# Patient Record
Sex: Female | Born: 1985
Health system: Southern US, Community
[De-identification: ages and names within clinical notes are randomized; demographics above are authoritative.]

## PROBLEM LIST (undated history)

## (undated) DIAGNOSIS — R519 Headache, unspecified: Secondary | ICD-10-CM

## (undated) DIAGNOSIS — N83202 Unspecified ovarian cyst, left side: Secondary | ICD-10-CM

## (undated) DIAGNOSIS — J302 Other seasonal allergic rhinitis: Secondary | ICD-10-CM

## (undated) DIAGNOSIS — Z87891 Personal history of nicotine dependence: Secondary | ICD-10-CM

## (undated) DIAGNOSIS — F419 Anxiety disorder, unspecified: Secondary | ICD-10-CM

## (undated) DIAGNOSIS — Z8249 Family history of ischemic heart disease and other diseases of the circulatory system: Secondary | ICD-10-CM

## (undated) DIAGNOSIS — K219 Gastro-esophageal reflux disease without esophagitis: Secondary | ICD-10-CM

## (undated) HISTORY — DX: Gastro-esophageal reflux disease without esophagitis: K21.9

## (undated) HISTORY — DX: Family history of ischemic heart disease and other diseases of the circulatory system: Z82.49

## (undated) HISTORY — PX: WISDOM TOOTH EXTRACTION: SHX21

## (undated) HISTORY — DX: Personal history of nicotine dependence: Z87.891

---

## 2015-04-25 ENCOUNTER — Emergency Department
Admission: EM | Admit: 2015-04-25 | Discharge: 2015-04-25 | Disposition: A | Discharge status: Home / Self Care | Payer: 59 | Source: Home / Self Care | Attending: Family Medicine | Admitting: Family Medicine

## 2015-04-25 ENCOUNTER — Encounter: Payer: Self-pay | Admitting: Emergency Medicine

## 2015-04-25 DIAGNOSIS — K219 Gastro-esophageal reflux disease without esophagitis: Secondary | ICD-10-CM

## 2015-04-25 HISTORY — DX: Other seasonal allergic rhinitis: J30.2

## 2015-04-25 MED ORDER — OMEPRAZOLE 40 MG PO CPDR: 40.0000 mg | DELAYED_RELEASE_CAPSULE | Freq: Every day | ORAL | Status: DC

## 2015-04-25 NOTE — ED Notes (Signed)
Patient reports pressure in sternum area that is dull and annoying x 3 days; has used pepsid for previous heartburn, but it has not helped current condition; denies n/v/d.

## 2015-04-25 NOTE — Discharge Instructions (Signed)

## 2015-04-25 NOTE — ED Provider Notes (Signed)
CSN: [id number]     Arrival date & time 04/25/15  1607 History   First MD Initiated Contact with Patient 04/25/15 1644     Chief Complaint  Patient presents with  . Chest Pain      HPI Comments: Patient complains of 3 day history of intermittent substernal ache, worse after drinking coffee.  She has had similar pain in the past controlled with Pepcid which has not been helpful for her present symptoms.  No nausea/vomiting.  She states that she had an EKG several days ago that showed short pr syndrome.  She reports that she has just moved to the area with a new job, and is under increased stress.  The history is provided by the patient.    Past Medical History  Diagnosis Date  . Seasonal allergies    History reviewed. No pertinent past surgical history. No family history on file. Social History  Substance Use Topics  . Smoking status: Never Smoker   . Smokeless tobacco: None  . Alcohol Use: No   OB History    No data available     Review of Systems  Constitutional: Positive for appetite change. Negative for fever, chills, diaphoresis, activity change, fatigue and unexpected weight change.  HENT: Negative.   Eyes: Negative.   Respiratory: Negative.   Cardiovascular: Positive for chest pain. Negative for palpitations and leg swelling.  Gastrointestinal: Negative for nausea, vomiting, abdominal pain, diarrhea, constipation, blood in stool, abdominal distention and rectal pain.  Genitourinary: Negative.   Musculoskeletal: Negative.   Neurological: Negative for dizziness and headaches.  All other systems reviewed and are negative.   Allergies  Review of patient's allergies indicates no known allergies.  Home Medications   Prior to Admission medications   Medication Sig Start Date End Date Taking? Authorizing Provider  famotidine (PEPCID AC) 10 MG chewable tablet Chew 10 mg by mouth 2 (two) times daily.   Yes Historical Provider, MD  loratadine (CLARITIN) 10 MG tablet Take 10  mg by mouth daily.   Yes Historical Provider, MD  omeprazole (PRILOSEC) 40 MG capsule Take 1 capsule (40 mg total) by mouth daily. Take 30 minutes before breakfast 04/25/15   Lattie Haw, MD   Meds Ordered and Administered this Visit  Medications - No data to display  BP 110/74 mmHg  Pulse 79  Temp(Src) 98.4 F (36.9 C) (Oral)  Resp 16  Ht  (1.549 m)  Wt 115 lb (52.164 kg)  BMI 21.74 kg/m2  SpO2 99% No data found.   Physical Exam Nursing notes and Vital Signs reviewed. Appearance:  Patient appears stated age, and in no acute distress Eyes:  Pupils are equal, round, and reactive to light and accomodation.  Extraocular movement is intact.  Conjunctivae are not inflamed  Ears:  Canals normal.  Tympanic membranes normal.  Nose:  Normal turbinates.  No sinus tenderness.   Pharynx:  Normal Neck:  Supple.   No adenopathy Lungs:  Clear to auscultation.  Breath sounds are equal.  Moving air well. Chest:  No tenderness to palpation  Heart:  Regular rate and rhythm without murmurs, rubs, or gallops.  Abdomen:  Nontender without masses or hepatosplenomegaly.  Bowel sounds are present.  No CVA or flank tenderness.  Extremities:  No edema.  Skin:  No rash present.   ED Course  Procedures  None    MDM   1. Gastroesophageal reflux disease, esophagitis presence not specified    Begin omeprazole  QAM.  #15, 1  refill Reflux precaution info given.   Followup with Family Doctor in about 10 days, if improved would decrease to omeprazole 20mg  daily.    Lattie HawStephen A Buddy Loeffelholz, MD 04/28/15 [phone]

## 2015-05-29 ENCOUNTER — Encounter: Payer: Self-pay | Admitting: Osteopathic Medicine

## 2015-05-29 ENCOUNTER — Ambulatory Visit (INDEPENDENT_AMBULATORY_CARE_PROVIDER_SITE_OTHER): Payer: 59 | Admitting: Osteopathic Medicine

## 2015-05-29 VITALS — BP 118/82 | HR 72 | Ht 61.0 in | Wt 115.0 lb

## 2015-05-29 DIAGNOSIS — Z139 Encounter for screening, unspecified: Secondary | ICD-10-CM | POA: Diagnosis not present

## 2015-05-29 DIAGNOSIS — K219 Gastro-esophageal reflux disease without esophagitis: Secondary | ICD-10-CM | POA: Diagnosis not present

## 2015-05-29 DIAGNOSIS — Z0189 Encounter for other specified special examinations: Secondary | ICD-10-CM | POA: Diagnosis not present

## 2015-05-29 DIAGNOSIS — Z79899 Other long term (current) drug therapy: Secondary | ICD-10-CM | POA: Diagnosis not present

## 2015-05-29 DIAGNOSIS — Z87891 Personal history of nicotine dependence: Secondary | ICD-10-CM | POA: Diagnosis not present

## 2015-05-29 DIAGNOSIS — Z975 Presence of (intrauterine) contraceptive device: Secondary | ICD-10-CM | POA: Insufficient documentation

## 2015-05-29 DIAGNOSIS — F458 Other somatoform disorders: Secondary | ICD-10-CM

## 2015-05-29 DIAGNOSIS — Z8249 Family history of ischemic heart disease and other diseases of the circulatory system: Secondary | ICD-10-CM | POA: Diagnosis not present

## 2015-05-29 DIAGNOSIS — R0989 Other specified symptoms and signs involving the circulatory and respiratory systems: Secondary | ICD-10-CM

## 2015-05-29 DIAGNOSIS — Z0000 Encounter for general adult medical examination without abnormal findings: Secondary | ICD-10-CM | POA: Diagnosis not present

## 2015-05-29 DIAGNOSIS — R198 Other specified symptoms and signs involving the digestive system and abdomen: Secondary | ICD-10-CM

## 2015-05-29 HISTORY — DX: Family history of ischemic heart disease and other diseases of the circulatory system: Z82.49

## 2015-05-29 HISTORY — DX: Personal history of nicotine dependence: Z87.891

## 2015-05-29 HISTORY — DX: Gastro-esophageal reflux disease without esophagitis: K21.9

## 2015-05-29 LAB — COMPLETE METABOLIC PANEL WITH GFR
ALT: 9 U/L (ref 6–29)
AST: 12 U/L (ref 10–30)
Albumin: 4.5 g/dL (ref 3.6–5.1)
Alkaline Phosphatase: 51 U/L (ref 33–115)
BUN: 11 mg/dL (ref 7–25)
CHLORIDE: 108 mmol/L (ref 98–110)
CO2: 24 mmol/L (ref 20–31)
CREATININE: 0.72 mg/dL (ref 0.50–1.10)
Calcium: 9.3 mg/dL (ref 8.6–10.2)
GFR, Est African American: >89 mL/min (ref >=60)
GFR, Est Non African American: >89 mL/min (ref >=60)
Glucose, Bld: 69 mg/dL (ref 65–99)
Potassium: 4.2 mmol/L (ref 3.5–5.3)
Sodium: 141 mmol/L (ref 135–146)
TOTAL PROTEIN: 6.8 g/dL (ref 6.1–8.1)
Total Bilirubin: 0.5 mg/dL (ref 0.2–1.2)

## 2015-05-29 LAB — CBC WITH DIFFERENTIAL/PLATELET
BASOS PCT: 0 %
Basophils Absolute: 0 cells/uL (ref 0–200)
Eosinophils Absolute: 152 cells/uL (ref 15–500)
Eosinophils Relative: 4 %
HEMATOCRIT: 42.3 % (ref 35.0–45.0)
Hemoglobin: 14.1 g/dL (ref 11.7–15.5)
LYMPHS PCT: 43 %
Lymphs Abs: 1634 cells/uL (ref 850–3900)
MCH: 28.7 pg (ref 27.0–33.0)
MCHC: 33.3 g/dL (ref 32.0–36.0)
MCV: 86.2 fL (ref 80.0–100.0)
MONOS PCT: 7 %
MPV: 10.6 fL (ref 7.5–12.5)
Monocytes Absolute: 266 cells/uL (ref 200–950)
Neutro Abs: 1748 cells/uL (ref 1500–7800)
Neutrophils Relative %: 46 %
PLATELETS: 218 10*3/uL (ref 140–400)
RBC: 4.91 MIL/uL (ref 3.80–5.10)
RDW: 13.8 % (ref 11.0–15.0)
WBC: 3.8 10*3/uL (ref 3.8–10.8)

## 2015-05-29 LAB — LIPID PANEL
Cholesterol: 138 mg/dL (ref 125–200)
HDL: 59 mg/dL (ref >=46)
LDL CALC: 72 mg/dL (ref <130)
Total CHOL/HDL Ratio: 2.3 Ratio (ref <=5.0)
Triglycerides: 37 mg/dL (ref <150)
VLDL: 7 mg/dL (ref <30)

## 2015-05-29 LAB — TSH: TSH: 1.25 m[IU]/L

## 2015-05-29 NOTE — Progress Notes (Signed)
HPI: Monique Ellison is a 30 y.o. female who presents to [facility] today for chief complaint of:  Chief Complaint  Patient presents with  . Establish Care    Patient wants Annual Physical, check on GERD and lump in throat     . Location: throat/chest . Quality: ACID REFLUX, LUMP IN THROAT . Severity: was pretty bad but now better . Duration: 4-5 days then UC 1 month ago  . Timing: usually with eating . Modifying factors: Nexium started about a week ago, Omeproazole by Urgent Care. Lump better since starting Nexium. GERD gone away entirely. She has cut back on coffee, on eating prior to bedtime.   Assoc signs/symptoms: NO significant pain, lateralization of the symptoms, dysphagia, odynophagia, weight loss, a change in voice, presence of a neck or tonsillar mass, or unexplained cervical adenopathy.    Past medical, social and family history reviewed: Past Medical History  Diagnosis Date  . Seasonal allergies    Past Surgical History  Procedure Laterality Date  . Wisdom tooth extraction     Social History  Substance Use Topics  . Smoking status: Never Smoker   . Smokeless tobacco: Not on file  . Alcohol Use: No   Family History  Problem Relation Age of Onset  . Diabetes Mother   . Hyperlipidemia Mother   . Stroke Mother   . Hypertension Father   . Stroke Father   . Depression Brother   . Diabetes Maternal Grandmother     Current Outpatient Prescriptions  Medication Sig Dispense Refill  . esomeprazole (NEXIUM) 20 MG packet Take 20 mg by mouth daily before breakfast.    . loratadine (CLARITIN) 10 MG tablet Take 10 mg by mouth daily.     No current facility-administered medications for this visit.   No Known Allergies     Review of Systems: CONSTITUTIONAL:  No  fever, no chills, No  unintentional weight changes HEAD/EYES/EARS/NOSE/THROAT: No  headache, no vision change, no hearing change, No  sore throat, No  sinus  pressure CARDIAC: No  chest pain, No  pressure, No palpitations, No  orthopnea RESPIRATORY: No  cough, No  shortness of breath/wheeze GASTROINTESTINAL: No  nausea, No  vomiting, No  abdominal pain, No  blood in stool, No  diarrhea, No  constipation  MUSCULOSKELETAL: No  myalgia/arthralgia GENITOURINARY: No  incontinence, No  abnormal genital bleeding/discharge SKIN: No  rash/wounds/concerning lesions HEM/ONC: No  easy bruising/bleeding, No  abnormal lymph node ENDOCRINE: No polyuria/polydipsia/polyphagia, No  heat/cold intolerance  NEUROLOGIC: No  weakness, No  dizziness, No  slurred speech PSYCHIATRIC: No  concerns with depression, No  concerns with anxiety, No sleep problems  Exam:  BP 118/82 mmHg  Pulse 72  Ht  (1.549 m)  Wt 115 lb (52.164 kg)  BMI 21.74 kg/m2 Constitutional: VS see above. General Appearance: alert, well-developed, well-nourished, NAD Eyes: Normal lids and conjunctive, non-icteric sclera,  Ears, Nose, Mouth, Throat: MMM, Normal external inspection ears/nares/mouth/lips/gums, TM normal bilaterally. Pharynx no erythema, no exudate.  Neck: No masses, trachea midline. No thyroid enlargement/tenderness/mass appreciated. No lymphadenopathy Respiratory: Normal respiratory effort. no wheeze, no rhonchi, no rales Cardiovascular: S1/S2 normal, no murmur, no rub/gallop auscultated. RRR.  Gastrointestinal: Nontender, no masses. No hepatomegaly, no splenomegaly. No hernia appreciated. Bowel sounds normal. Rectal exam deferred.   Psychiatric: Normal judgment/insight. Normal mood and affect. Oriented x3.    No results found for this or any previous visit (from the past 72 hour(s)).    ASSESSMENT/PLAN: See  patient instructions, plan for 6 weeks PPI therapy then transition to H2 blocker or stop therapy altogether. Consider GI referral if symptoms worsen or fail to improve, RTC precautions reviewed if patient getting worse or if anything changes despite therapy. Otherwise plan  to follow-up for annual wellness exam/Pap.  Gastroesophageal reflux disease, esophagitis presence not specified - Plan: CBC with Differential/Platelet, COMPLETE METABOLIC PANEL WITH GFR  Globus sensation - Plan: TSH  Former smoker  Nexplanon in place  Family history of heart disease - Plan: Lipid panel  Screening - Plan: CBC with Differential/Platelet, COMPLETE METABOLIC PANEL WITH GFR, Lipid panel, TSH  Medication management - Plan: CBC with Differential/Platelet, COMPLETE METABOLIC PANEL WITH GFR   Return in about 6 weeks (around 07/10/2015), or (can come sooner if you'd like), for [employer] SoupNUAL WELLNESS EXAM/ RECHECK GI SYMPTOMS.

## 2015-05-29 NOTE — Patient Instructions (Addendum)
Plan to stay on the Nexium for another 6 weeks or so, then he can come off of this medication and see how the heartburn is doing. At that point you can wait and see if the heartburn has resolved, or you can transition to Zantac (ranitidine is generic) at either 150 mg twice a day or 300 mg once per day.   If heartburn persists or worsens, at that point we would consider a referral to GI for an endoscopy to evaluate for any other concerning causes of heartburn such as ulcers or gastritis. There is also a bacteria, H. pylori, which can cause heartburn symptoms however the better tests for this are not as accurate when someone has been on a medication such as omeprazole or Nexium, if we are looking at sending you to GI, they can test for this bacteria as well.   Regarding the globus sensation, this should get better as we treat the heartburn, however if it is persisting or getting worse we will send to GI for further testing and they will likely want to do a scope of the esophagus.   Please let us know if you're getting worse or if anything changes, otherwise let's plan to meet up again either in 6 weeks or so once you are off of the heartburn medicines, or we can do the annual physical/Pap smear sooner than that if you would like.

## 2015-06-12 ENCOUNTER — Telehealth: Payer: Self-pay | Admitting: *Deleted

## 2015-06-12 NOTE — Telephone Encounter (Signed)
Pt canceled appointment.

## 2015-06-15 ENCOUNTER — Ambulatory Visit: Payer: Self-pay | Admitting: Family Medicine

## 2015-08-10 ENCOUNTER — Telehealth: Payer: 59 | Admitting: Nurse Practitioner

## 2015-08-10 DIAGNOSIS — N3000 Acute cystitis without hematuria: Secondary | ICD-10-CM

## 2015-08-10 MED ORDER — SULFAMETHOXAZOLE-TRIMETHOPRIM 800-160 MG PO TABS: 1.0000 | ORAL_TABLET | Freq: Two times a day (BID) | ORAL | Status: DC

## 2015-08-10 NOTE — Progress Notes (Signed)

## 2015-11-25 DIAGNOSIS — Z6822 Body mass index (BMI) 22.0-22.9, adult: Secondary | ICD-10-CM | POA: Diagnosis not present

## 2015-11-25 DIAGNOSIS — Z01419 Encounter for gynecological examination (general) (routine) without abnormal findings: Secondary | ICD-10-CM | POA: Diagnosis not present

## 2015-11-25 LAB — HM PAP SMEAR: HM Pap smear: NEGATIVE

## 2016-05-04 DIAGNOSIS — Z3046 Encounter for surveillance of implantable subdermal contraceptive: Secondary | ICD-10-CM | POA: Diagnosis not present

## 2016-07-08 ENCOUNTER — Telehealth: Payer: 59 | Admitting: Family

## 2016-07-08 DIAGNOSIS — B9689 Other specified bacterial agents as the cause of diseases classified elsewhere: Secondary | ICD-10-CM | POA: Diagnosis not present

## 2016-07-08 DIAGNOSIS — J028 Acute pharyngitis due to other specified organisms: Secondary | ICD-10-CM | POA: Diagnosis not present

## 2016-07-08 MED ORDER — AZITHROMYCIN 250 MG PO TABS: ORAL_TABLET | ORAL | 0 refills | Status: DC

## 2016-07-08 MED ORDER — BENZONATATE 100 MG PO CAPS: 100.0000 mg | ORAL_CAPSULE | Freq: Three times a day (TID) | ORAL | 0 refills | Status: DC | PRN

## 2016-07-08 NOTE — Progress Notes (Signed)
We are sorry that you are not feeling well.  Here is how we plan to help!  Based on what you have shared with me it looks like you have upper respiratory tract inflammation that has resulted in a significant cough.  Inflammation and infection in the upper respiratory tract is commonly called bronchitis and has four common causes:  Allergies, Viral Infections, Acid Reflux and Bacterial Infections.  Allergies, viruses and acid reflux are treated by controlling symptoms or eliminating the cause. An example might be a cough caused by taking certain blood pressure medications. You stop the cough by changing the medication. Another example might be a cough caused by acid reflux. Controlling the reflux helps control the cough.  Based on your presentation I believe you most likely have A cough due to bacteria.  When patients have a fever and a productive cough with a change in color or increased sputum production, we are concerned about bacterial bronchitis.  If left untreated it can progress to pneumonia.  If your symptoms do not improve with your treatment plan it is important that you contact your provider.   I have prescribed Azithromyin 250 mg: two tables now and then one tablet daily for 4 additonal days    In addition you may use A non-prescription cough medication called Robitussin DAC. Take 2 teaspoons every 8 hours or Delsym: take 2 teaspoons every 12 hours. and A prescription cough medication called Tessalon Perles 100mg . You may take 1-2 capsules every 8 hours as needed for your cough.   USE OF BRONCHODILATOR ("RESCUE") INHALERS: There is a risk from using your bronchodilator too frequently.  The risk is that over-reliance on a medication which only relaxes the muscles surrounding the breathing tubes can reduce the effectiveness of medications prescribed to reduce swelling and congestion of the tubes themselves.  Although you feel brief relief from the bronchodilator inhaler, your asthma may actually  be worsening with the tubes becoming more swollen and filled with mucus.  This can delay other crucial treatments, such as oral steroid medications. If you need to use a bronchodilator inhaler daily, several times per day, you should discuss this with your provider.  There are probably better treatments that could be used to keep your asthma under control.     HOME CARE . Only take medications as instructed by your medical team. . Complete the entire course of an antibiotic. . Drink plenty of fluids and get plenty of rest. . Avoid close contacts especially the very young and the elderly . Cover your mouth if you cough or cough into your sleeve. . Always remember to wash your hands . A steam or ultrasonic humidifier can help congestion.   GET HELP RIGHT AWAY IF: . You develop worsening fever. . You become short of breath . You cough up blood. . Your symptoms persist after you have completed your treatment plan MAKE SURE YOU   Understand these instructions.  Will watch your condition.  Will get help right away if you are not doing well or get worse.  Your e-visit answers were reviewed by a board certified advanced clinical practitioner to complete your personal care plan.  Depending on the condition, your plan could have included both over the counter or prescription medications. If there is a problem please reply  once you have received a response from your provider. Your safety is important to [place].  If you have drug allergies check your prescription carefully.    You can use MyChart to  ask questions about today's visit, request a non-urgent call back, or ask for a work or school excuse for 24 hours related to this e-Visit. If it has been greater than 24 hours you will need to follow up with your provider, or enter a new e-Visit to address those concerns. You will get an e-mail in the next two days asking about your experience.  I hope that your e-visit has been valuable and will speed  your recovery. Thank you for using e-visits.

## 2016-07-26 ENCOUNTER — Encounter: Payer: Self-pay | Admitting: Osteopathic Medicine

## 2016-07-26 ENCOUNTER — Ambulatory Visit (INDEPENDENT_AMBULATORY_CARE_PROVIDER_SITE_OTHER): Payer: 59 | Admitting: Osteopathic Medicine

## 2016-07-26 VITALS — BP 111/74 | HR 64 | Ht 61.0 in | Wt 119.0 lb

## 2016-07-26 DIAGNOSIS — R21 Rash and other nonspecific skin eruption: Secondary | ICD-10-CM | POA: Diagnosis not present

## 2016-07-26 DIAGNOSIS — W57XXXA Bitten or stung by nonvenomous insect and other nonvenomous arthropods, initial encounter: Secondary | ICD-10-CM

## 2016-07-26 MED ORDER — DOXYCYCLINE HYCLATE 100 MG PO TABS: 100.0000 mg | ORAL_TABLET | Freq: Two times a day (BID) | ORAL | 0 refills | Status: DC

## 2016-07-26 NOTE — Patient Instructions (Signed)
Plan: I think we are dealing with a localized reaction to some kind of bite/sting, but let's do antibiotics just to be safe since the rash is spreading a bit. If anything changes or gets worse, especially if you develop joint pain or fever, please let us know right away!

## 2016-07-26 NOTE — Progress Notes (Signed)
HPI: Monique Ellison is a 31 y.o. female  who [facility], 07/[place]/16,  for chief complaint of:  Chief Complaint  Patient presents with  . Insect Bite    UPPER RIGHT THIGH     . Context: No known sting or bite, no tick was ever visible . Location: R upper anterior thigh . Quality: redness, some itching, nonpainful . Duration: noticed 4 days ago . Modifying factors: has been using hydrocortisone OTC on it . Assoc signs/symptoms: No fever/chills, no joint pain        Past medical history, surgical history, social history and family history reviewed.  Patient Active Problem List   Diagnosis Date Noted  . GERD (gastroesophageal reflux disease) 05/29/2015  . Former smoker 05/29/2015  . Nexplanon in place 05/29/2015  . Family history of heart disease 05/29/2015    Current medication list and allergy/intolerance information reviewed.   Current Outpatient Prescriptions on File Prior to Visit  Medication Sig Dispense Refill  . esomeprazole (NEXIUM) 20 MG packet Take 20 mg by mouth daily before breakfast.    . loratadine (CLARITIN) 10 MG tablet Take 10 mg by mouth daily.     No current facility-administered medications on file prior to visit.    No Known Allergies    Review of Systems:  Constitutional: No recent illness  HEENT: No  headache  Cardiac: No  chest pain  Respiratory:  No  shortness of breath.  Gastrointestinal: No  abdominal pain  Musculoskeletal: No new myalgia/arthralgia  Skin: +Rash  Neurologic: No  Weakness\   Exam:  BP 111/74   Pulse 64   Ht 5\' 1"  (1.549 m)   Wt 119 lb (54 kg)   BMI 22.48 kg/m   Constitutional: VS see above. General Appearance: alert, well-developed, well-nourished, NAD  Neck: No masses, trachea midline.   Respiratory: Normal respiratory effort.  Musculoskeletal: Gait normal. Symmetric and independent movement of all extremities  Neurological: Normal  balance/coordination. No tremor.  Skin: warm, dry, intact. See photograph above, irregularly bordered erythematous patch about 10 cm x 7 cm, no central clearing, small scabbed area in the middle examined under magnification, no stinger present  Psychiatric: Normal judgment/insight. Normal mood and affect. Oriented x3.     ASSESSMENT/PLAN:   Insect bite, initial encounter - Plan: doxycycline (VIBRA-TABS) 100 MG tablet     Patient Instructions  Plan: I think we are dealing with a localized reaction to some kind of bite/sting, but let's do antibiotics just to be safe since the rash is spreading a bit. If anything changes or gets worse, especially if you develop joint pain or fever, please let us know right away!     Follow-up plan: Return if symptoms worsen or fail to improve.  Visit summary with medication list and pertinent instructions was printed for patient to review, alert us if any changes needed. All questions at time of visit were answered - patient instructed to contact office with any additional concerns. ER/RTC precautions were reviewed with the patient and understanding verbalized.

## 2016-07-28 ENCOUNTER — Ambulatory Visit: Payer: Self-pay | Admitting: Osteopathic Medicine

## 2016-08-22 ENCOUNTER — Encounter: Payer: Self-pay | Admitting: Osteopathic Medicine

## 2016-08-22 ENCOUNTER — Ambulatory Visit (INDEPENDENT_AMBULATORY_CARE_PROVIDER_SITE_OTHER): Payer: 59 | Admitting: Osteopathic Medicine

## 2016-08-22 VITALS — BP 114/67 | HR 68 | Wt 118.0 lb

## 2016-08-22 DIAGNOSIS — G44209 Tension-type headache, unspecified, not intractable: Secondary | ICD-10-CM | POA: Diagnosis not present

## 2016-08-22 MED ORDER — BUTALBITAL-APAP-CAFFEINE 50-325-40 MG PO TABS: 1.0000 | ORAL_TABLET | Freq: Four times a day (QID) | ORAL | 0 refills | Status: DC | PRN

## 2016-08-22 MED ORDER — ASPIRIN-ACETAMINOPHEN-CAFFEINE 250-250-65 MG PO TABS: 1.0000 | ORAL_TABLET | Freq: Four times a day (QID) | ORAL | 2 refills | Status: DC | PRN

## 2016-08-22 NOTE — Progress Notes (Signed)
HPI: Monique Ellison is a 31 y.o. female  who [facility], 08/22/16,  for chief complaint of:  Chief Complaint  Patient presents with  . Headache    x 2 weeks    HEADACHE . Location: frontal/temporal/occipital, on L usually but occasionally both sides  . Quality: throbbing . Severity: mild to moderate . Duration: 2 weeks  . Timing: worse during evenings, occasional in mornings . Context: can't relate to lack of sleep or dehydration  . Modifying factors: has tried ibuprofen with some relief . Assoc signs/symptoms: no - photophobia/ phonophobia/ vision changes/ weakness/ speech changes/   Consideration for imaging: none at this time - NEGATIVE HISTORY OF FOLLOWING -  ?Focal neurologic signs or symptoms ?Onset of headache with exertion, cough, or sexual activity ?Orbital bruit ?Onset of headache after age 30 years ?Recent significant change in the pattern, frequency, or severity of headaches ?Progressive worsening of headache despite appropriate therapy    Past medical history, surgical history, social history and family history reviewed.  Patient Active Problem List   Diagnosis Date Noted  . Bite, insect 07/26/2016  . GERD (gastroesophageal reflux disease) 05/29/2015  . Former smoker 05/29/2015  . Nexplanon in place 05/29/2015  . Family history of heart disease 05/29/2015    Current medication list and allergy/intolerance information reviewed.   Current Outpatient Prescriptions on File Prior to Visit  Medication Sig Dispense Refill  . doxycycline (VIBRA-TABS) 100 MG tablet Take 1 tablet (100 mg total) by mouth 2 (two) times daily. 14 tablet 0  . esomeprazole (NEXIUM) 20 MG packet Take 20 mg by mouth daily before breakfast.    . loratadine (CLARITIN) 10 MG tablet Take 10 mg by mouth daily.     No current facility-administered medications on file prior to visit.    No Known Allergies    Review of  Systems:  Constitutional: No recent illness  HEENT: +headache, no vision change  Cardiac: No  chest pain, No  pressure, No palpitations  Respiratory:  No  shortness of breath. No  Cough  Gastrointestinal: No  abdominal pain, no change on bowel habits  Musculoskeletal: No new myalgia/arthralgia  Skin: No  Rash  Neurologic: No  weakness, No  Dizziness   Exam:  BP 114/67   Pulse 68   Wt 118 lb (53.5 kg)   SpO2 100%   BMI 22.30 kg/m   Constitutional: VS see above. General Appearance: alert, well-developed, well-nourished, NAD  Eyes: Normal lids and conjunctive, non-icteric sclera  Ears, Nose, Mouth, Throat: MMM, Normal external inspection ears/nares/mouth/lips/gums.  Neck: No masses, trachea midline.   Respiratory: Normal respiratory effort. no wheeze, no rhonchi, no rales  Cardiovascular: S1/S2 normal, no murmur, no rub/gallop auscultated. RRR.   Musculoskeletal: Gait normal. Symmetric and independent movement of all extremities  Neurological: Normal balance/coordination. No tremor. Cranial nerves intact, normal cerebellar reflexes, EOMI, PERRLA, no nystagmus, muscle strength 5 out of 5 in all 4 extremities  Skin: warm, dry, intact.   Psychiatric: Normal judgment/insight. Normal mood and affect. Oriented x3.    ASSESSMENT/PLAN: The encounter diagnosis was Tension headache.   At this point, I am more suspicious of tension headache as opposed to more serious cause (patient had a friend who in college was having headaches and eventually diagnosed with brain tumor, so she is a bit concerned and wants to make sure nothing serious is going on). No aura symptoms and the pattern/character doesn't quite fit with migraine.   Patient reassured  that no serious symptoms or neurologic findings. Will trial abortive medications, can try ibuprofen but I would probably recommend Excedrin Migraine with aspirin/acetaminophen/caffeine. Fioricet as needed for severe symptoms but advised  limited use of this to avoid dependence/rebound. If headaches persist/worsen, would consider neurology referral for further evaluation  Outpatient Encounter Prescriptions as of 08/22/2016  Medication Sig  . esomeprazole (NEXIUM) 20 MG packet Take 20 mg by mouth daily before breakfast.  . ibuprofen (ADVIL,MOTRIN) 200 MG tablet Take 600 mg by mouth every 6 (six) hours as needed.  . loratadine (CLARITIN) 10 MG tablet Take 10 mg by mouth daily.  Marland Kitchen aspirin-acetaminophen-caffeine (EXCEDRIN MIGRAINE) 250-250-65 MG tablet Take 1-2 tablets by mouth every 6 (six) hours as needed for headache ((mild to moderate)).  . butalbital-acetaminophen-caffeine (FIORICET, ESGIC) 50-325-40 MG tablet Take 1 tablet by mouth every 6 (six) hours as needed for headache ((severe)).  . [DISCONTINUED] doxycycline (VIBRA-TABS) 100 MG tablet Take 1 tablet (100 mg total) by mouth 2 (two) times daily.   No facility-administered encounter medications on file as of 08/22/2016.          Follow-up plan: Return in about 2 weeks (around 09/05/2016) for recheck headache if not better, sooner if needed.  Visit summary with medication list and pertinent instructions was printed for patient to review, alert [place] if any changes needed. All questions at time of visit were answered - patient instructed to contact office with any additional concerns. ER/RTC precautions were reviewed with the patient and understanding verbalized.

## 2016-08-22 NOTE — Patient Instructions (Signed)

## 2016-08-25 ENCOUNTER — Ambulatory Visit: Payer: Self-pay | Admitting: Osteopathic Medicine

## 2016-08-25 ENCOUNTER — Encounter: Payer: Self-pay | Admitting: Osteopathic Medicine

## 2016-08-26 DIAGNOSIS — H5203 Hypermetropia, bilateral: Secondary | ICD-10-CM | POA: Diagnosis not present

## 2016-09-03 ENCOUNTER — Encounter: Payer: Self-pay | Admitting: Osteopathic Medicine

## 2016-11-03 ENCOUNTER — Ambulatory Visit (INDEPENDENT_AMBULATORY_CARE_PROVIDER_SITE_OTHER): Payer: 59 | Admitting: Osteopathic Medicine

## 2016-11-03 ENCOUNTER — Encounter: Payer: Self-pay | Admitting: Osteopathic Medicine

## 2016-11-03 VITALS — BP 88/60 | HR 67 | Ht 61.0 in | Wt 117.0 lb

## 2016-11-03 DIAGNOSIS — M7918 Myalgia, other site: Secondary | ICD-10-CM

## 2016-11-03 DIAGNOSIS — M791 Myalgia: Secondary | ICD-10-CM | POA: Diagnosis not present

## 2016-11-03 MED ORDER — CYCLOBENZAPRINE HCL 10 MG PO TABS: 5.0000 mg | ORAL_TABLET | Freq: Three times a day (TID) | ORAL | 0 refills | Status: DC | PRN

## 2016-11-03 MED ORDER — NAPROXEN 500 MG PO TABS: 500.0000 mg | ORAL_TABLET | Freq: Two times a day (BID) | ORAL | 1 refills | Status: DC

## 2016-11-03 MED ORDER — METHYLPREDNISOLONE 4 MG PO TBPK: ORAL_TABLET | ORAL | 0 refills | Status: DC

## 2016-11-03 NOTE — Progress Notes (Signed)
HPI: Monique Ellison is a 31 y.o. female  who presents to [facility] [place] today, 11/03/16,  for chief complaint of:  Chief Complaint  Patient presents with  . Shoulder Pain    right shoulder pain x3days    . Context: No injury, posisble overuse from working out  . Location: R shoulder area between shoulder blade and spine, joint itself is not bothering her  . Quality: shooting  . Duration: worse past 3 days  . Timing: worse with head rotation to the R and with extension of the arm. . Modifying factors:Heating pads, Icy Hot, Ibuprofen 6000 q6 h helps a bit   Past medical history, surgical history, social history and family history reviewed.  Patient Active Problem List   Diagnosis Date Noted  . Tension headache 08/22/2016  . Bite, insect 07/26/2016  . GERD (gastroesophageal reflux disease) 05/29/2015  . Former smoker 05/29/2015  . Nexplanon in place 05/29/2015  . Family history of heart disease 05/29/2015    Current medication list and allergy/intolerance information reviewed.   Current Outpatient Prescriptions on File Prior to Visit  Medication Sig Dispense Refill  . aspirin-acetaminophen-caffeine (EXCEDRIN MIGRAINE) 250-250-65 MG tablet Take 1-2 tablets by mouth every 6 (six) hours as needed for headache ((mild to moderate)). 30 tablet 2  . butalbital-acetaminophen-caffeine (FIORICET, ESGIC) 50-325-40 MG tablet Take 1 tablet by mouth every 6 (six) hours as needed for headache ((severe)). 14 tablet 0  . ibuprofen (ADVIL,MOTRIN) 200 MG tablet Take 600 mg by mouth every 6 (six) hours as needed.    . loratadine (CLARITIN) 10 MG tablet Take 10 mg by mouth daily.     No current facility-administered medications on file prior to visit.    No Known Allergies    Review of Systems:  Constitutional: No recent illness  Cardiac: No  chest pain,   Respiratory:  No  shortness of breath. No  Cough  Musculoskeletal: + myalgia/arthralgia  Skin: No   Rash  Neurologic: No  weakness, No  Dizziness  Exam:  BP (!) 88/60   Pulse 67   Ht 5\' 1"  (1.549 m)   Wt 117 lb (53.1 kg)   BMI 22.11 kg/m   Constitutional: VS see above. General Appearance: alert, well-developed, well-nourished, NAD  Neck: No masses, trachea midline.   Respiratory: Normal respiratory effort.   Musculoskeletal: Gait normal. Symmetric and independent movement of all extremities. Normal neck rom, neg drop arm test, neg empty can test, (+)spasm in R rhomboid palpable, no midline spinal tenderness  Neurological: Normal balance/coordination. No tremor.  Skin: warm, dry, intact.   Psychiatric: Normal judgment/insight. Normal mood and affect. Oriented x3.    ASSESSMENT/PLAN:   Rhomboid muscle pain - OMT treatment to rhomboid, neck, T-spine w/ FPR, MFR, HVLA to some patient relief. Trial meds & home exercises, consdier PT/chiro - Plan: cyclobenzaprine (FLEXERIL) 10 MG tablet, naproxen (NAPROSYN) 500 MG tablet, methylPREDNISolone (MEDROL DOSEPAK) 4 MG TBPK tablet     Follow-up plan: Return if symptoms worsen or fail to improve.  Visit summary with medication list and pertinent instructions was printed for patient to review, alert us if any changes needed. All questions at time of visit were answered - patient instructed to contact office with any additional concerns. ER/RTC precautions were reviewed with the patient and understanding verbalized.

## 2017-01-19 DIAGNOSIS — N907 Vulvar cyst: Secondary | ICD-10-CM | POA: Diagnosis not present

## 2017-02-20 DIAGNOSIS — F410 Panic disorder [episodic paroxysmal anxiety] without agoraphobia: Secondary | ICD-10-CM | POA: Insufficient documentation

## 2017-02-22 ENCOUNTER — Ambulatory Visit (INDEPENDENT_AMBULATORY_CARE_PROVIDER_SITE_OTHER): Payer: 59 | Admitting: Osteopathic Medicine

## 2017-02-22 ENCOUNTER — Encounter: Payer: Self-pay | Admitting: Osteopathic Medicine

## 2017-02-22 VITALS — BP 117/83 | HR 101 | Wt 119.6 lb

## 2017-02-22 DIAGNOSIS — F419 Anxiety disorder, unspecified: Secondary | ICD-10-CM | POA: Diagnosis not present

## 2017-02-22 DIAGNOSIS — R002 Palpitations: Secondary | ICD-10-CM

## 2017-02-22 MED ORDER — ALPRAZOLAM 0.5 MG PO TABS: 0.2500 mg | ORAL_TABLET | Freq: Three times a day (TID) | ORAL | 0 refills | Status: DC | PRN

## 2017-02-22 MED ORDER — ESCITALOPRAM OXALATE 10 MG PO TABS: 10.0000 mg | ORAL_TABLET | Freq: Every day | ORAL | 1 refills | Status: DC

## 2017-02-22 NOTE — Progress Notes (Signed)
HPI: Monique Ellison is a 32 y.o. female who  has a past medical history of Family history of heart disease (05/29/2015), Former smoker (05/29/2015), GERD (gastroesophageal reflux disease) (05/29/2015), and Seasonal allergies.  she presents to [facility] [place] today, 02/22/17,  for chief complaint of: Panic attacks and anxiety   Significant situational stressors mainly revolving around family health issues of parents, lack of support available from other family members. Patient is taking on a lot of responsibility and this has escalated over the past few months as her parents had more health issues. She states on Sunday she had an episode lasting a few hours, out of nowhere, reported palpitations, feeling especially anxious, unable to concentrate. Has happened a few times since then. No specific triggers, no significant dizziness or lightheadedness, no chest pain, no trouble breathing. She states the stress of her family issues have been really getting to her, she is tearful on interview. She states she is drinking to get into see a counselor   Past medical, surgical, social and family history reviewed:  Patient Active Problem List   Diagnosis Date Noted  . Tension headache 08/22/2016  . Bite, insect 07/26/2016  . GERD (gastroesophageal reflux disease) 05/29/2015  . Former smoker 05/29/2015  . Nexplanon in place 05/29/2015  . Family history of heart disease 05/29/2015    Past Surgical History:  Procedure Laterality Date  . WISDOM TOOTH EXTRACTION      Social History   Tobacco Use  . Smoking status: Never Smoker  . Smokeless tobacco: Never Used  Substance Use Topics  . Alcohol use: No    Family History  Problem Relation Age of Onset  . Diabetes Mother   . Hyperlipidemia Mother   . Stroke Mother   . Hypertension Father   . Stroke Father   . Depression Brother   . Diabetes Maternal Grandmother   . Heart attack Father      Current medication list  and allergy/intolerance information reviewed:    Current Outpatient Medications  Medication Sig Dispense Refill  . cyclobenzaprine (FLEXERIL) 10 MG tablet Take 0.5-1 tablets (5-10 mg total) by mouth 3 (three) times daily as needed. 45 tablet 0  . loratadine (CLARITIN) 10 MG tablet Take 10 mg by mouth daily.    . naproxen (NAPROSYN) 500 MG tablet Take 1 tablet (500 mg total) by mouth 2 (two) times daily with a meal. 30 tablet 1   No current facility-administered medications for this visit.     No Known Allergies    Review of Systems:  Constitutional:  No  fever, no chills,  HEENT: No  headache, no vision change,  Cardiac: No  chest pain, No  pressure, +palpitations associated with self-described panic attack  Respiratory:  No  shortness of breath. No  Cough  Gastrointestinal: No  abdominal pain, No  nausea  Musculoskeletal: No new myalgia/arthralgia  Endocrine: No cold intolerance,  No heat intolerance. No polyuria/polydipsia/polyphagia   Neurologic: No  weakness, No  dizziness,   Psychiatric: No  concerns with depression, +concerns with anxiety, No sleep problems, No mood problems  Exam:  BP 117/83   Pulse (!) 101   Wt 119 lb 9.6 oz (54.3 kg)   BMI 22.60 kg/m   Constitutional: VS see above. General Appearance: alert, well-developed, well-nourished, NAD  Eyes: Normal lids and conjunctive, non-icteric sclera  Ears, Nose, Mouth, Throat: MMM, Normal external inspection ears/nares/mouth/lips/gums. TM normal bilaterally. Pharynx/tonsils no erythema, no exudate. Nasal mucosa normal.   Neck:  No masses, trachea midline. No thyroid enlargement. No tenderness/mass appreciated. No lymphadenopathy  Respiratory: Normal respiratory effort. no wheeze, no rhonchi, no rales  Cardiovascular: S1/S2 normal, no murmur, no rub/gallop auscultated. RRR.   Musculoskeletal: Gait normal.   Neurological: Normal balance/coordination. No tremor.   Skin: warm, dry, intact.     Psychiatric:  Normal judgment/insight. Depressed/anxious mood and affect. Oriented x3. No SI, no HI, no thought disorder.    Results for orders placed or performed in visit on 02/22/17 (from the past 72 hour(s))  CBC     Status: Abnormal   Collection Time: 02/22/17  3:22 PM  Result Value Ref Range   WBC 6.2 3.8 - 10.8 Thousand/uL   RBC 5.17 (H) 3.80 - 5.10 Million/uL   Hemoglobin 14.8 11.7 - 15.5 g/dL   HCT 16.1 09.6 - 04.5 %   MCV 84.7 80.0 - 100.0 fL   MCH 28.6 27.0 - 33.0 pg   MCHC 33.8 32.0 - 36.0 g/dL   RDW 40.9 81.1 - 91.4 %   Platelets 246 140 - 400 Thousand/uL   MPV 10.6 7.5 - 12.5 fL  COMPLETE METABOLIC PANEL WITH GFR     Status: None   Collection Time: 02/22/17  3:22 PM  Result Value Ref Range   Glucose, Bld 87 65 - 99 mg/dL    Comment: .            Fasting reference interval .    BUN 7 7 - 25 mg/dL   Creat 7.82 9.56 - 2.13 mg/dL   GFR, Est Non African American 117 > OR = 60 mL/min/1.41m2   GFR, Est African American 136 > OR = 60 mL/min/1.52m2   BUN/Creatinine Ratio NOT APPLICABLE 6 - 22 (calc)   Sodium 137 135 - 146 mmol/L   Potassium 4.1 3.5 - 5.3 mmol/L   Chloride 104 98 - 110 mmol/L   CO2 26 20 - 32 mmol/L   Calcium 9.7 8.6 - 10.2 mg/dL   Total Protein 7.4 6.1 - 8.1 g/dL   Albumin 5.1 3.6 - 5.1 g/dL   Globulin 2.3 1.9 - 3.7 g/dL (calc)   AG Ratio 2.2 1.0 - 2.5 (calc)   Total Bilirubin 0.7 0.2 - 1.2 mg/dL   Alkaline phosphatase (APISO) 57 33 - 115 U/L   AST 14 10 - 30 U/L   ALT 8 6 - 29 U/L  TSH     Status: None   Collection Time: 02/22/17  3:22 PM  Result Value Ref Range   TSH 0.91 mIU/L    Comment:           Reference Range .           > or = 20 Years  0.40-4.50 .                Pregnancy Ranges           First trimester    0.26-2.66           Second trimester   0.55-2.73           Third trimester    0.43-2.91     ASSESSMENT/PLAN:   Anxiety - Seems mostly situational in nature but given likely that this situation is not going to resolve any time soon and  significant impact on quality of life, go ahead and start SSRI/benzodiazepine bridge - Plan: CBC, COMPLETE METABOLIC PANEL WITH GFR, TSH  Palpitation - Plan: CBC, COMPLETE METABOLIC PANEL WITH GFR, TSH   Meds ordered this  encounter  Medications  . escitalopram (LEXAPRO) 10 MG tablet    Sig: Take 1 tablet (10 mg total) by mouth daily.    Dispense:  30 tablet    Refill:  1  . ALPRAZolam (XANAX) 0.5 MG tablet    Sig: Take 0.5-1 tablets (0.25-0.5 mg total) by mouth 3 (three) times daily as needed for anxiety.    Dispense:  30 tablet    Refill:  0      Patient Instructions  We are starting a medication today called Lexapro to help treat your anxiety. This is a daily medication to help control your symptoms.   I also highly encourage my patients who are suffering from anxiety to seek care with a counselor or therapist. A therapist can coach you in techniques to recognize and deal with troubling thought patterns and behaviors. The ability to cope with external stressors is crucial to overall mental health.  Try the as-needed medicine Xanax up to 3 times per day, try half dose first as this drug can cause sleepiness. Limit this medication to emergency use, and try to avoid using it for sleeping - these can increase risk of dependence.   Let's plan to follow up in the office in 1-2 weeks. At that time, we can talk about how well the medicine is working for you, and we can consider increasing the dose, adding another medicine, etc.   If we are having trouble finding a good medication regimen for you, we can consult with a psychiatrist to assist with medication management.   If you experience problematic side effects, please let me know ASAP - we can switch the medicine any time, and we don't need an appointment for this.   Any questions or concerns, call me!      Visit summary with medication list and pertinent instructions was printed for patient to review. All questions at time of visit  were answered - patient instructed to contact office with any additional concerns. ER/RTC precautions were reviewed with the patient.   Follow-up plan: Return in about 2 weeks (around 03/08/2017) for recheck anxiety .  Note: Total time spent 25 minutes, greater than 50% of the visit was spent face-to-face counseling and coordinating care for the following: The primary encounter diagnosis was Anxiety. A diagnosis of Palpitation was also pertinent to this visit.Marland Kitchen  Please note: voice recognition software was used to produce this document, and typos may escape review. Please contact Dr. Lyn Hollingshead for any needed clarifications.

## 2017-02-22 NOTE — Patient Instructions (Signed)
We are starting a medication today called Lexapro to help treat your anxiety. This is a daily medication to help control your symptoms.   I also highly encourage my patients who are suffering from anxiety to seek care with a counselor or therapist. A therapist can coach you in techniques to recognize and deal with troubling thought patterns and behaviors. The ability to cope with external stressors is crucial to overall mental health.  Try the as-needed medicine Xanax up to 3 times per day, try half dose first as this drug can cause sleepiness. Limit this medication to emergency use, and try to avoid using it for sleeping - these can increase risk of dependence.   Let's plan to follow up in the office in 1-2 weeks. At that time, we can talk about how well the medicine is working for you, and we can consider increasing the dose, adding another medicine, etc.   If we are having trouble finding a good medication regimen for you, we can consult with a psychiatrist to assist with medication management.   If you experience problematic side effects, please let me know ASAP - we can switch the medicine any time, and we don't need an appointment for this.   Any questions or concerns, call me!

## 2017-02-23 ENCOUNTER — Encounter: Payer: Self-pay | Admitting: Osteopathic Medicine

## 2017-02-23 DIAGNOSIS — F419 Anxiety disorder, unspecified: Secondary | ICD-10-CM | POA: Insufficient documentation

## 2017-02-23 LAB — COMPLETE METABOLIC PANEL WITH GFR
AG Ratio: 2.2 (calc) (ref 1.0–2.5)
ALKALINE PHOSPHATASE (APISO): 57 U/L (ref 33–115)
ALT: 8 U/L (ref 6–29)
AST: 14 U/L (ref 10–30)
Albumin: 5.1 g/dL (ref 3.6–5.1)
BILIRUBIN TOTAL: 0.7 mg/dL (ref 0.2–1.2)
BUN: 7 mg/dL (ref 7–25)
CO2: 26 mmol/L (ref 20–32)
CREATININE: 0.68 mg/dL (ref 0.50–1.10)
Calcium: 9.7 mg/dL (ref 8.6–10.2)
Chloride: 104 mmol/L (ref 98–110)
GFR, Est African American: 136 mL/min/{1.73_m2} (ref >=60)
GFR, Est Non African American: 117 mL/min/{1.73_m2} (ref >=60)
GLOBULIN: 2.3 g/dL (ref 1.9–3.7)
GLUCOSE: 87 mg/dL (ref 65–99)
Potassium: 4.1 mmol/L (ref 3.5–5.3)
SODIUM: 137 mmol/L (ref 135–146)
Total Protein: 7.4 g/dL (ref 6.1–8.1)

## 2017-02-23 LAB — CBC
HEMATOCRIT: 43.8 % (ref 35.0–45.0)
Hemoglobin: 14.8 g/dL (ref 11.7–15.5)
MCH: 28.6 pg (ref 27.0–33.0)
MCHC: 33.8 g/dL (ref 32.0–36.0)
MCV: 84.7 fL (ref 80.0–100.0)
MPV: 10.6 fL (ref 7.5–12.5)
Platelets: 246 10*3/uL (ref 140–400)
RBC: 5.17 10*6/uL — ABNORMAL HIGH (ref 3.80–5.10)
RDW: 12.4 % (ref 11.0–15.0)
WBC: 6.2 10*3/uL (ref 3.8–10.8)

## 2017-02-23 LAB — TSH: TSH: 0.91 m[IU]/L

## 2017-02-24 DIAGNOSIS — F411 Generalized anxiety disorder: Secondary | ICD-10-CM | POA: Diagnosis not present

## 2017-02-28 ENCOUNTER — Ambulatory Visit: Payer: Self-pay | Admitting: Osteopathic Medicine

## 2017-03-03 DIAGNOSIS — F411 Generalized anxiety disorder: Secondary | ICD-10-CM | POA: Diagnosis not present

## 2017-03-08 ENCOUNTER — Ambulatory Visit: Payer: 59 | Admitting: Osteopathic Medicine

## 2017-03-14 DIAGNOSIS — F411 Generalized anxiety disorder: Secondary | ICD-10-CM | POA: Diagnosis not present

## 2017-03-15 ENCOUNTER — Ambulatory Visit (INDEPENDENT_AMBULATORY_CARE_PROVIDER_SITE_OTHER): Payer: 59 | Admitting: Osteopathic Medicine

## 2017-03-15 ENCOUNTER — Encounter: Payer: Self-pay | Admitting: Osteopathic Medicine

## 2017-03-15 VITALS — BP 113/80 | HR 96 | Wt 114.5 lb

## 2017-03-15 DIAGNOSIS — F419 Anxiety disorder, unspecified: Secondary | ICD-10-CM

## 2017-03-15 NOTE — Progress Notes (Signed)
HPI: Monique Ellison is a 32 y.o. female who  has a past medical history of Family history of heart disease (05/29/2015), Former smoker (05/29/2015), GERD (gastroesophageal reflux disease) (05/29/2015), and Seasonal allergies.  she [facility] to [facility] [place] today, 03/15/17,  for chief complaint of: Panic attacks and anxiety follow-up   Last visit 02/22/2017, we started Lexapro w/ bridging Xanax for anxiety issues. She was having significant situational stressors mainly revolving around family health issues of parents, lack of support available from other family members. She had an episode lasting a few hours, out of nowhere, reported palpitations, feeling especially anxious, unable to concentrate. Has happened a few times. No specific triggers, no significant dizziness or lightheadedness, no chest pain, no trouble breathing. Labs were all ok.   Today, reports overall anxiety is a good bit better. She is using the Xanax only occasionally. Reporting some persistent vague imbalance/dizziness, no history of vertigo. No abdominal pain or nausea/vomiting. Only noticed this after starting Lexapro. Since anxiety is better, she is hesitant to discontinue or switch medicines would like to try going down a bit on the dose   Past medical, surgical, social and family history reviewed:  Patient Active Problem List   Diagnosis Date Noted  . Anxiety 02/23/2017  . Tension headache 08/22/2016  . Bite, insect 07/26/2016  . GERD (gastroesophageal reflux disease) 05/29/2015  . Former smoker 05/29/2015  . Nexplanon in place 05/29/2015  . Family history of heart disease 05/29/2015    Past Surgical History:  Procedure Laterality Date  . WISDOM TOOTH EXTRACTION      Social History   Tobacco Use  . Smoking status: Never Smoker  . Smokeless tobacco: Never Used  Substance Use Topics  . Alcohol use: No    Family History  Problem Relation Age of Onset  . Diabetes Mother   .  Hyperlipidemia Mother   . Stroke Mother   . Hypertension Father   . Stroke Father   . Depression Brother   . Diabetes Maternal Grandmother   . Heart attack Father      Current medication list and allergy/intolerance information reviewed:    Current Outpatient Medications  Medication Sig Dispense Refill  . ALPRAZolam (XANAX) 0.5 MG tablet Take 0.5-1 tablets (0.25-0.5 mg total) by mouth 3 (three) times daily as needed for anxiety. 30 tablet 0  . cyclobenzaprine (FLEXERIL) 10 MG tablet Take 0.5-1 tablets (5-10 mg total) by mouth 3 (three) times daily as needed. 45 tablet 0  . escitalopram (LEXAPRO) 10 MG tablet Take 1 tablet (10 mg total) by mouth daily. 30 tablet 1  . loratadine (CLARITIN) 10 MG tablet Take 10 mg by mouth daily.    . naproxen (NAPROSYN) 500 MG tablet Take 1 tablet (500 mg total) by mouth 2 (two) times daily with a meal. 30 tablet 1   No current facility-administered medications for this visit.     No Known Allergies    Review of Systems:  Constitutional:  No  fever, no chills,  HEENT: No  headache, no vision change,  Cardiac: No  chest pain, No  pressure, +palpitations associated with self-described panic attack as per HPI  Respiratory:  No  shortness of breath. No  Cough  Gastrointestinal: No  abdominal pain, No  nausea  Musculoskeletal: No new myalgia/arthralgia  Endocrine: No cold intolerance,  No heat intolerance. No polyuria/polydipsia/polyphagia   Neurologic: No  weakness, No  dizziness,   Psychiatric: No  concerns with depression, +concerns with anxiety, No  sleep problems, No mood problems  Exam:  BP 113/80   Pulse 96   Wt 114 lb 8 oz (51.9 kg)   LMP 02/21/2017   BMI 21.63 kg/m   Constitutional: VS see above. General Appearance: alert, well-developed, well-nourished, NAD  Eyes: Normal lids and conjunctive, non-icteric sclera  Ears, Nose, Mouth, Throat: MMM, Normal external inspection ears/nares/mouth/lips/gums.    Respiratory: Normal  respiratory effort. no wheeze, no rhonchi, no rales  Cardiovascular: S1/S2 normal, no murmur, no rub/gallop auscultated. RRR.   Musculoskeletal: Gait normal.   Neurological: Normal balance/coordination. No tremor.   Skin: warm, dry, intact.     Psychiatric: Normal judgment/insight. Normal mood and affect. Oriented x3. No SI, no HI, no thought disorder.    No results found for this or any previous visit (from the past 72 hour(s)).  ASSESSMENT/PLAN:   Anxiety  Patient Instructions  Plan:  Decrease to 1/2 tablet Lexapro  Continue Xanax as needed  Call me if still dizzy  Otherwise, if doing ok on the 5 mg Lexapro, see me in another month or so   If we switch meds, see me 2-4 weeks after starting new medicine     Visit summary with medication list and pertinent instructions was printed for patient to review. All questions at time of visit were answered - patient instructed to contact office with any additional concerns. ER/RTC precautions were reviewed with the patient.   Follow-up plan: Return if symptoms worsen or fail to improve / as directed .  Note: Total time spent 25 minutes, greater than 50% of the visit was spent face-to-face counseling and coordinating care for the following: The encounter diagnosis was Anxiety..  Please note: voice recognition software was used to produce this document, and typos may escape review. Please contact Dr. Lyn Hollingshead for any needed clarifications.

## 2017-03-15 NOTE — Patient Instructions (Signed)
Plan:  Decrease to 1/2 tablet Lexapro  Continue Xanax as needed  Call me if still dizzy  Otherwise, if doing ok on the 5 mg Lexapro, see me in another month or so   If we switch meds, see me 2-4 weeks after starting new medicine

## 2017-03-16 ENCOUNTER — Encounter: Payer: Self-pay | Admitting: Osteopathic Medicine

## 2017-04-06 DIAGNOSIS — F411 Generalized anxiety disorder: Secondary | ICD-10-CM | POA: Diagnosis not present

## 2017-04-19 ENCOUNTER — Other Ambulatory Visit: Payer: Self-pay | Admitting: Osteopathic Medicine

## 2017-04-19 NOTE — Telephone Encounter (Signed)
We'll confirm with patient the dose that she is taking, if she would like me to send in a 5 mg tablet, I can do that. If she has gone back up to 10 mg, will continue at 10 mg.

## 2017-04-19 NOTE — Telephone Encounter (Signed)
Pharmacy requesting medication refill. It appears pt should be taking 1/2 tab (5 mg) according to last visit. If so, refill may be too soon. Pls advise. Thanks.

## 2017-04-20 NOTE — Telephone Encounter (Signed)
Let's confirm with patient the dose that she is taking, if she would like me to send in a 5 mg tablet, I can do that. If she has gone back up to 10 mg, will continue at 10 mg.

## 2017-04-20 NOTE — Telephone Encounter (Signed)
Pt is taking Lexapro 10 mg. Requesting for rx to be sent to [facility] [place]. Thanks.

## 2017-04-21 ENCOUNTER — Other Ambulatory Visit: Payer: Self-pay | Admitting: Osteopathic Medicine

## 2017-04-21 DIAGNOSIS — F411 Generalized anxiety disorder: Secondary | ICD-10-CM | POA: Diagnosis not present

## 2017-04-21 MED ORDER — ESCITALOPRAM OXALATE 10 MG PO TABS: 10.0000 mg | ORAL_TABLET | Freq: Every day | ORAL | 3 refills | Status: DC

## 2017-04-21 MED FILL — ESCITALOPRAM 10 MG TABLET: 10 | 90 days supply | Qty: 90 | Fill #0

## 2017-04-21 NOTE — Telephone Encounter (Signed)
Rx was sent electronically successfully to pt's preferred pharmacy - [facility].

## 2017-04-21 NOTE — Telephone Encounter (Signed)
Epic is not letting me send it electronically to [facility], since we received an electronic refill request from [facility] (annoying - it's not even letting me print the prescription).    I'll go ahead and close this encounter and send it from a different encounter but please call the patient and let her know that if she wants to use: Pharmacy permanently she will need to cancel the prescription at [facility] so we do not receive any more refill request from them.

## 2017-05-02 DIAGNOSIS — F411 Generalized anxiety disorder: Secondary | ICD-10-CM | POA: Diagnosis not present

## 2017-05-17 DIAGNOSIS — F411 Generalized anxiety disorder: Secondary | ICD-10-CM | POA: Diagnosis not present

## 2017-05-30 DIAGNOSIS — F411 Generalized anxiety disorder: Secondary | ICD-10-CM | POA: Diagnosis not present

## 2017-06-14 DIAGNOSIS — F411 Generalized anxiety disorder: Secondary | ICD-10-CM | POA: Diagnosis not present

## 2017-07-04 DIAGNOSIS — Z3046 Encounter for surveillance of implantable subdermal contraceptive: Secondary | ICD-10-CM | POA: Diagnosis not present

## 2017-07-04 DIAGNOSIS — F411 Generalized anxiety disorder: Secondary | ICD-10-CM | POA: Diagnosis not present

## 2017-07-04 MED FILL — TRI-PREVIFEM 0.18/0.215/0.2: 0.18/0.215/ | 84 days supply | Qty: 84 | Fill #0

## 2017-07-26 MED FILL — ESCITALOPRAM 10 MG TABLET: 10 | 90 days supply | Qty: 90 | Fill #1

## 2017-07-31 DIAGNOSIS — F411 Generalized anxiety disorder: Secondary | ICD-10-CM | POA: Diagnosis not present

## 2017-08-03 ENCOUNTER — Encounter: Payer: 59 | Admitting: Osteopathic Medicine

## 2017-08-16 ENCOUNTER — Ambulatory Visit (INDEPENDENT_AMBULATORY_CARE_PROVIDER_SITE_OTHER): Payer: 59 | Admitting: Osteopathic Medicine

## 2017-08-16 ENCOUNTER — Encounter: Payer: Self-pay | Admitting: Osteopathic Medicine

## 2017-08-16 VITALS — BP 119/75 | HR 74 | Temp 98.2°F | Wt 123.3 lb

## 2017-08-16 DIAGNOSIS — Z8249 Family history of ischemic heart disease and other diseases of the circulatory system: Secondary | ICD-10-CM

## 2017-08-16 DIAGNOSIS — F419 Anxiety disorder, unspecified: Secondary | ICD-10-CM | POA: Diagnosis not present

## 2017-08-16 DIAGNOSIS — Z0000 Encounter for general adult medical examination without abnormal findings: Secondary | ICD-10-CM | POA: Diagnosis not present

## 2017-08-16 DIAGNOSIS — J302 Other seasonal allergic rhinitis: Secondary | ICD-10-CM

## 2017-08-16 NOTE — Progress Notes (Signed)
HPI: Monique Ellison is a 32 y.o. female who  has a past medical history of Family history of heart disease (05/29/2015), Former smoker (05/29/2015), GERD (gastroesophageal reflux disease) (05/29/2015), and Seasonal allergies.  she presents to [facility] today, 08/16/17,  for chief complaint of: Annual Physical   Patient here for annual physical / wellness exam.  See preventive care reviewed as below.  Recent labs reviewed in detail with the patient.   Additional concerns today include:  Allergies have been bad, claritin doesn't seem to be workign as well as it used to.    Past medical, surgical, social and family history reviewed:  Patient Active Problem List   Diagnosis Date Noted  . Family history of heart disease 08/16/2017  . Anxiety 02/23/2017  . Tension headache 08/22/2016  . GERD (gastroesophageal reflux disease) 05/29/2015  . Former smoker 05/29/2015    Past Surgical History:  Procedure Laterality Date  . WISDOM TOOTH EXTRACTION      Social History   Tobacco Use  . Smoking status: Never Smoker  . Smokeless tobacco: Never Used  Substance Use Topics  . Alcohol use: No    Family History  Problem Relation Age of Onset  . Diabetes Mother   . Hyperlipidemia Mother   . Stroke Mother   . Hypertension Father   . Stroke Father   . Depression Brother   . Diabetes Maternal Grandmother   . Heart attack Father      Current medication list and allergy/intolerance information reviewed:    Current Outpatient Medications  Medication Sig Dispense Refill  . ALPRAZolam (XANAX) 0.5 MG tablet Take 0.5-1 tablets (0.25-0.5 mg total) by mouth 3 (three) times daily as needed for anxiety. 30 tablet 0  . cyclobenzaprine (FLEXERIL) 10 MG tablet Take 0.5-1 tablets (5-10 mg total) by mouth 3 (three) times daily as needed. 45 tablet 0  . escitalopram (LEXAPRO) 10 MG tablet Take 1 tablet (10 mg total) by mouth daily. 90 tablet 3  . loratadine  (CLARITIN) 10 MG tablet Take 10 mg by mouth daily.    . naproxen (NAPROSYN) 500 MG tablet Take 1 tablet (500 mg total) by mouth 2 (two) times daily with a meal. 30 tablet 1   No current facility-administered medications for this visit.     No Known Allergies    Review of Systems:  Constitutional:  No  fever, no chills, No recent illness, No unintentional weight changes. No significant fatigue.   HEENT: No  headache, no vision change, no hearing change, No sore throat, +sinus pressure  Cardiac: No  chest pain, No  pressure, No palpitations, No  Orthopnea  Respiratory:  No  shortness of breath. No  Cough  Gastrointestinal: No  abdominal pain, No  nausea, No  vomiting,  No  blood in stool, No  diarrhea, No  constipation   Musculoskeletal: No new myalgia/arthralgia  Skin: No  Rash, No other wounds/concerning lesions  Genitourinary: No  incontinence, No  abnormal genital bleeding, No abnormal genital discharge  Hem/Onc: No  easy bruising/bleeding, No  abnormal lymph node  Endocrine: No cold intolerance,  No heat intolerance. No polyuria/polydipsia/polyphagia   Neurologic: No  weakness, No  dizziness,   Psychiatric: No  concerns with depression, No  concerns with anxiety, No sleep problems, No mood problems  Exam:  BP 119/75 (BP Location: Left Arm, Patient Position: Sitting, Cuff Size: Normal)   Pulse 74   Temp 98.2 F (36.8 C) (Oral)   Wt 123  lb 4.8 oz (55.9 kg)   BMI 23.30 kg/m   Constitutional: VS see above. General Appearance: alert, well-developed, well-nourished, NAD  Eyes: Normal lids and conjunctive, non-icteric sclera  Ears, Nose, Mouth, Throat: MMM, Normal external inspection ears/nares/mouth/lips/gums. TM normal bilaterally. Pharynx/tonsils no erythema, no exudate. Nasal mucosa normal.   Neck: No masses, trachea midline. No thyroid enlargement. No tenderness/mass appreciated. No lymphadenopathy  Respiratory: Normal respiratory effort. no wheeze, no rhonchi, no  rales  Cardiovascular: S1/S2 normal, no murmur, no rub/gallop auscultated. RRR. No lower extremity edema. Pedal pulse II/IV bilaterally DP and PT. No carotid bruit or JVD. No abdominal aortic bruit.  Gastrointestinal: Nontender, no masses. No hepatomegaly, no splenomegaly. No hernia appreciated. Bowel sounds normal. Rectal exam deferred.   Musculoskeletal: Gait normal. No clubbing/cyanosis of digits.   Neurological: Normal balance/coordination. No tremor. No cranial nerve deficit on limited exam. Motor intact and symmetric. Cerebellar reflexes intact.   Skin: warm, dry, intact. No rash/ulcer. No concerning nevi or subq nodules on limited exam.    Psychiatric: Normal judgment/insight. Normal mood and affect. Oriented x3.      ASSESSMENT/PLAN: The primary encounter diagnosis was Annual physical exam. Diagnoses of Anxiety and Seasonal allergies were also pertinent to this visit.   Patient Instructions  Even fairly severe allergies can typically be treated successfully with over-the-counter medications. I usually will recommend a combination of: . steroid nasal spray such as Flonase or Nasonex or one of their generic equivalents, PLUS... . an antihistamine such as Allegra, Zyrtec, Xyxal, or Claritin or one of their generic equivalents.  . Can also add a decongestant, either Sudafed on its own OR any of the antihistamines with the "-D" in the name that he has to show your ID to the pharmacist to buy.  . I recommend avoiding Afrin nasal spray unless absolutely needed, and then using it only for 2 days to prevent rebound congestion when the medicine is stopped.  If the combination isn't helping after about 2 weeks, let me know and I can send in a prescription antihistamine nasal spray to use with the oral medications as well. The above medicines will work for most people, though.     General Preventive Care  Most recent routine screening lipids/other labs: ordered today  Tobacco: don't!  Alcohol: moderation is ok for most people. Recreational/Illicit Drugs: don't! Exercise: as tolerated to reduce risk of cardiovascular disease and diabetes  Mental health: if need for mental health care (medicine change, counseling, other), or concerns about moods, please let me know!   Sexual health: if need for pregnancy prevention or STD testing, please let me know!   Vaccines  Flu vaccine: recommended every fall (by Halloween!)  Shingles vaccine: Shingrix recommended after age 56  Pneumonia vaccines: Prevnar and Pneumovax recommended after age 56  Tetanus booster: Tdap recommended every 10 years  Cancer screenings   Colon cancer screening: recommended at age 27, colonoscopy sooner if risk factors   Breast cancer screening: recommended at age 74, sooner if risk factors   Cervical cancer screening: every 1 to 5 years depending on age and other risk factors.  Infection screenings . HIV: recommended screening at least once age 29-65, more often if risk factors  . Gonorrhea/Chlamydia: many insurances require testing for anyone on birth control pills, otherwise screening as needed . Hepatitis C: recommended for anyone born 10-1962  Other . Bone Density Test: recommended for women at age 10 . Advanced Directive: Living Will and/or Healthcare Power of Attorney recommended for everyone, regardless  of age or health . Diabetes & Cholesterol: recommended screening annually for high risk patients . Thyroid and Vitamin D: routine screening not needed          Visit summary with medication list and pertinent instructions was printed for patient to review. All questions at time of visit were answered - patient instructed to contact office with any additional concerns. ER/RTC precautions were reviewed with the patient.   Follow-up plan: Return in about 1 year (around 08/17/2018) for annual wellness check-up, sooner if needed .    Please note: voice recognition software was used to  produce this document, and typos may escape review. Please contact Dr. Lyn Hollingshead for any needed clarifications.

## 2017-08-16 NOTE — Patient Instructions (Addendum)
Even fairly severe allergies can typically be treated successfully with over-the-counter medications. I usually will recommend a combination of: . steroid nasal spray such as Flonase or Nasonex or one of their generic equivalents, PLUS... . an antihistamine such as Allegra, Zyrtec, Xyxal, or Claritin or one of their generic equivalents.  . Can also add a decongestant, either Sudafed on its own OR any of the antihistamines with the "-D" in the name that he has to show your ID to the pharmacist to buy.  . I recommend avoiding Afrin nasal spray unless absolutely needed, and then using it only for 2 days to prevent rebound congestion when the medicine is stopped.  If the combination isn't helping after about 2 weeks, let me know and I can send in a prescription antihistamine nasal spray to use with the oral medications as well. The above medicines will work for most people, though.     General Preventive Care  Most recent routine screening lipids/other labs: ordered today  Tobacco: don't! Alcohol: moderation is ok for most people. Recreational/Illicit Drugs: don't! Exercise: as tolerated to reduce risk of cardiovascular disease and diabetes  Mental health: if need for mental health care (medicine change, counseling, other), or concerns about moods, please let me know!   Sexual health: if need for pregnancy prevention or STD testing, please let me know!   Vaccines  Flu vaccine: recommended every fall (by Halloween!)  Shingles vaccine: Shingrix recommended after age 31  Pneumonia vaccines: Prevnar and Pneumovax recommended after age 31  Tetanus booster: Tdap recommended every 10 years  Cancer screenings   Colon cancer screening: recommended at age 31, colonoscopy sooner if risk factors   Breast cancer screening: recommended at age 31, sooner if risk factors   Cervical cancer screening: every 1 to 5 years depending on age and other risk factors.  Infection screenings . HIV: recommended  screening at least once age 31-65, more often if risk factors  . Gonorrhea/Chlamydia: many insurances require testing for anyone on birth control pills, otherwise screening as needed . Hepatitis C: recommended for anyone born 251945-1964  Other . Bone Density Test: recommended for women at age 31 . Advanced Directive: Living Will and/or Healthcare Power of Attorney recommended for everyone, regardless of age or health . Diabetes & Cholesterol: recommended screening annually for high risk patients . Thyroid and Vitamin D: routine screening not needed

## 2017-08-31 DIAGNOSIS — F411 Generalized anxiety disorder: Secondary | ICD-10-CM | POA: Diagnosis not present

## 2017-09-14 DIAGNOSIS — F411 Generalized anxiety disorder: Secondary | ICD-10-CM | POA: Diagnosis not present

## 2017-09-25 MED FILL — TRI FEMYNOR 28 TABLET: 0.18/0.215/ | 84 days supply | Qty: 84 | Fill #1

## 2017-10-02 DIAGNOSIS — F411 Generalized anxiety disorder: Secondary | ICD-10-CM | POA: Diagnosis not present

## 2017-10-26 MED FILL — ESCITALOPRAM 10 MG TABLET: 10 | 90 days supply | Qty: 90 | Fill #2

## 2017-11-16 DIAGNOSIS — F411 Generalized anxiety disorder: Secondary | ICD-10-CM | POA: Diagnosis not present

## 2017-12-21 MED FILL — TRI FEMYNOR 28 TABLET: 0.18/0.215/ | 84 days supply | Qty: 84 | Fill #2

## 2017-12-22 DIAGNOSIS — F411 Generalized anxiety disorder: Secondary | ICD-10-CM | POA: Diagnosis not present

## 2018-01-12 DIAGNOSIS — F411 Generalized anxiety disorder: Secondary | ICD-10-CM | POA: Diagnosis not present

## 2018-01-25 ENCOUNTER — Telehealth: Payer: 59 | Admitting: Family Medicine

## 2018-01-25 DIAGNOSIS — R109 Unspecified abdominal pain: Secondary | ICD-10-CM

## 2018-01-25 DIAGNOSIS — N898 Other specified noninflammatory disorders of vagina: Secondary | ICD-10-CM

## 2018-01-25 NOTE — Progress Notes (Signed)
Based on what you shared with me it looks like you have a condition that should be evaluated in a face to face office visit.  NOTE: If you entered your credit card information for this eVisit, you will not be charged. You may see a "hold" on your card for the $30 but that hold will drop off and you will not have a charge processed.  If you are having a true medical emergency please call 911.  If you need an urgent face to face visit, [facility] has four urgent care centers for your convenience.  If you need care fast and have a high deductible or no insurance consider:   [email or web address] to reserve your spot online an avoid wait times  [facility] [address], [address] [place], [place] [address] 8 am to 8 pm Monday-Friday 10 am to 4 pm Saturday-Sunday *Across the street from [employer]  [facility]  [address] [place], [address] 8 am to 5 pm Monday-Friday * In the [facility]   The following sites will take your  insurance:  . [facility]  [phone] Get Driving Directions Find a Provider at this Location  [address] [place], [place] [address] . 10 am to 8 pm Monday-Friday . 12 pm to 8 pm Saturday-Sunday   . [facility]  [phone] Get Driving Directions Find a Provider at this Location  [address], [address] [place], [place] [address] . 8 am to 8 pm Monday-Friday . 9 am to 6 pm Saturday . 11 am to 6 pm Sunday   . [facility] [place]  [phone] Get Driving Directions  [address] [place], [place] [address] . 8 am to 8 pm Monday-Friday . 8 am to 4 pm Saturday-Sunday   Your e-visit answers were reviewed by a board certified advanced clinical practitioner to complete your personal care plan.  Thank you for using e-Visits. 

## 2018-01-26 ENCOUNTER — Ambulatory Visit (HOSPITAL_COMMUNITY)
Admission: EM | Admit: 2018-01-26 | Discharge: 2018-01-26 | Disposition: A | Discharge status: Home / Self Care | Payer: 59 | Attending: Family Medicine | Admitting: Family Medicine

## 2018-01-26 ENCOUNTER — Encounter (HOSPITAL_COMMUNITY): Payer: Self-pay

## 2018-01-26 DIAGNOSIS — R300 Dysuria: Secondary | ICD-10-CM | POA: Diagnosis not present

## 2018-01-26 DIAGNOSIS — Z79899 Other long term (current) drug therapy: Secondary | ICD-10-CM | POA: Diagnosis not present

## 2018-01-26 DIAGNOSIS — N898 Other specified noninflammatory disorders of vagina: Secondary | ICD-10-CM

## 2018-01-26 DIAGNOSIS — Z8249 Family history of ischemic heart disease and other diseases of the circulatory system: Secondary | ICD-10-CM | POA: Diagnosis not present

## 2018-01-26 DIAGNOSIS — Z833 Family history of diabetes mellitus: Secondary | ICD-10-CM | POA: Diagnosis not present

## 2018-01-26 DIAGNOSIS — Z87891 Personal history of nicotine dependence: Secondary | ICD-10-CM | POA: Insufficient documentation

## 2018-01-26 DIAGNOSIS — Z823 Family history of stroke: Secondary | ICD-10-CM | POA: Insufficient documentation

## 2018-01-26 DIAGNOSIS — N3000 Acute cystitis without hematuria: Secondary | ICD-10-CM | POA: Diagnosis not present

## 2018-01-26 LAB — POCT URINALYSIS DIP (DEVICE)
BILIRUBIN URINE: NEGATIVE
Glucose, UA: NEGATIVE mg/dL
Ketones, ur: NEGATIVE mg/dL
Nitrite: POSITIVE — AB
Protein, ur: NEGATIVE mg/dL
SPECIFIC GRAVITY, URINE: 1.025 (ref 1.005–1.030)
Urobilinogen, UA: 0.2 mg/dL (ref 0.0–1.0)
pH: 6 (ref 5.0–8.0)

## 2018-01-26 LAB — POCT PREGNANCY, URINE: Preg Test, Ur: NEGATIVE

## 2018-01-26 MED ORDER — NITROFURANTOIN MONOHYD MACRO 100 MG PO CAPS: 100.0000 mg | ORAL_CAPSULE | Freq: Two times a day (BID) | ORAL | 0 refills | Status: DC

## 2018-01-26 MED ORDER — PHENAZOPYRIDINE HCL 200 MG PO TABS: 200.0000 mg | ORAL_TABLET | Freq: Three times a day (TID) | ORAL | 0 refills | Status: DC

## 2018-01-26 MED FILL — PHENAZOPYRIDINE 200 MG TAB: 200 | 3 days supply | Qty: 6 | Fill #0

## 2018-01-26 MED FILL — NITROFURANTOIN MONO-MCR 100: 100 | 5 days supply | Qty: 10 | Fill #0

## 2018-01-26 NOTE — ED Provider Notes (Signed)
[facility]   CC: UTI symptoms  SUBJECTIVE:  Monique Ellison is a 32 y.o. female who complains of dysuria, and burning on the outside of her vagina for the past 4-5 days ago.  Patient admits to recent floatation therapy, and sexual activity this past weekend, and decreased water intake for the past 5-6 days.  Patient is sexually active with husband of 7 years.  Not concerned for STIs.  Admits to lower abdominal pressure with urination.  Has NOT tried OTC medications.  Symptoms are made worse with urination.  Admits to similar symptoms in the past.  Complains of LT sided flank pain last night, intermittent episodes of vaginal itching, and mild vaginal odor.  Denies fever, chills, nausea, vomiting, abdominal pain, abnormal vaginal discharge or bleeding, hematuria.    LMP: Patient's last menstrual period was 01/18/2018.  ROS: As in HPI.  Past Medical History:  Diagnosis Date  . Family history of heart disease 05/29/2015   Father had MI in 28's  . Former smoker 05/29/2015   Quit 2008  . GERD (gastroesophageal reflux disease) 05/29/2015  . Seasonal allergies    Past Surgical History:  Procedure Laterality Date  . WISDOM TOOTH EXTRACTION     No Known Allergies No current facility-administered medications on file prior to encounter.    Current Outpatient Medications on File Prior to Encounter  Medication Sig Dispense Refill  . ALPRAZolam (XANAX) 0.5 MG tablet Take 0.5-1 tablets (0.25-0.5 mg total) by mouth 3 (three) times daily as needed for anxiety. 30 tablet 0  . cyclobenzaprine (FLEXERIL) 10 MG tablet Take 0.5-1 tablets (5-10 mg total) by mouth 3 (three) times daily as needed. 45 tablet 0  . escitalopram (LEXAPRO) 10 MG tablet Take 1 tablet (10 mg total) by mouth daily. 90 tablet 3  . loratadine (CLARITIN) 10 MG tablet Take 10 mg by mouth daily.    . Norgestimate-Ethinyl Estradiol Triphasic (TRI-SPRINTEC) 0.18/0.215/0.25 MG-35 MCG tablet      Social History   Socioeconomic  History  . Marital status: Married    Spouse name: Not on file  . Number of children: Not on file  . Years of education: Not on file  . Highest education level: Not on file  Occupational History  . Not on file  Social Needs  . Financial resource strain: Not on file  . Food insecurity:    Worry: Not on file    Inability: Not on file  . Transportation needs:    Medical: Not on file    Non-medical: Not on file  Tobacco Use  . Smoking status: Never Smoker  . Smokeless tobacco: Never Used  Substance and Sexual Activity  . Alcohol use: No  . Drug use: Not on file  . Sexual activity: Not on file  Lifestyle  . Physical activity:    Days per week: Not on file    Minutes per session: Not on file  . Stress: Not on file  Relationships  . Social connections:    Talks on phone: Not on file    Gets together: Not on file    Attends religious service: Not on file    Active member of club or organization: Not on file    Attends meetings of clubs or organizations: Not on file    Relationship status: Not on file  . Intimate partner violence:    Fear of current or ex partner: Not on file    Emotionally abused: Not on file    Physically abused:  Not on file    Forced sexual activity: Not on file  Other Topics Concern  . Not on file  Social History Narrative  . Not on file   Family History  Problem Relation Age of Onset  . Diabetes Mother   . Hyperlipidemia Mother   . Stroke Mother   . Hypertension Father   . Stroke Father   . Heart attack Father   . Depression Brother   . Diabetes Maternal Grandmother     OBJECTIVE:  Vitals:   01/26/18 1210  BP: 113/80  Pulse: 71  Resp: 20  Temp: 97.7 F (36.5 C)  TempSrc: Oral  SpO2: 99%   General appearance: AOx3 in no acute distress HEENT: NCAT.  Oropharynx clear.  Lungs: clear to auscultation bilaterally without adventitious breath sounds Heart: regular rate and rhythm.  Radial pulses 2+ symmetrical bilaterally Abdomen: soft;  non-distended; mild suprapubic pain; bowel sounds present; no masses or organomegaly; no guarding or rebound tenderness Back: no CVA tenderness Extremities: no edema; symmetrical with no gross deformities Skin: warm and dry Neurologic: Ambulates from chair to exam table without difficulty Psychological: alert and cooperative; normal mood and affect  Labs Reviewed  POCT URINALYSIS DIP (DEVICE) - Abnormal; Notable for the following components:      Result Value   Hgb urine dipstick MODERATE (*)    Nitrite POSITIVE (*)    Leukocytes, UA SMALL (*)    All other components within normal limits  URINE CULTURE  POC URINE PREG, ED  POCT PREGNANCY, URINE  CERVICOVAGINAL ANCILLARY ONLY    ASSESSMENT & PLAN:  1. Dysuria   2. Acute cystitis without hematuria   3. Vaginal irritation     Meds ordered this encounter  Medications  . nitrofurantoin, macrocrystal-monohydrate, (MACROBID) 100 MG capsule    Sig: Take 1 capsule (100 mg total) by mouth 2 (two) times daily.    Dispense:  10 capsule    Refill:  0    Order Specific Question:   Supervising Provider    Answer:   Eustace MooreNELSON, YVONNE SUE [[id number]]  . phenazopyridine (PYRIDIUM) 200 MG tablet    Sig: Take 1 tablet (200 mg total) by mouth 3 (three) times daily.    Dispense:  6 tablet    Refill:  0    Order Specific Question:   Supervising Provider    Answer:   Eustace MooreELSON, YVONNE SUE [[id number]]   Patient declines treatment for potential yeast infection today.  Would like to wait on test results  Urine culture sent.  Vaginal cytology obtained. We will call you with the results.   Push fluids and get plenty of rest.   Take antibiotic as directed and to completion Take pyridium as prescribed and as needed for symptomatic relief Follow up with PCP if symptoms persists Return here or go to ER if you have any new or worsening symptoms such as fever, worsening abdominal pain, nausea/vomiting, flank pain, etc...  Outlined signs and symptoms indicating  need for more acute intervention. Patient verbalized understanding. After Visit Summary given.     Rennis HardingWurst, Raetta Agostinelli, PA-C 01/26/18 1242

## 2018-01-26 NOTE — ED Triage Notes (Signed)
Pt presents with urinary tract symptoms; lower pelvic pain with urination, lower back pain, pain with urination, and pain in the outer & inner part of vaginal opening.

## 2018-01-26 NOTE — Discharge Instructions (Signed)
Urine culture sent.  Vaginal cytology obtained. We will call you with the results.   Push fluids and get plenty of rest.   Take antibiotic as directed and to completion Take pyridium as prescribed and as needed for symptomatic relief Follow up with PCP if symptoms persists Return here or go to ER if you have any new or worsening symptoms such as fever, worsening abdominal pain, nausea/vomiting, flank pain, etc...Marland Kitchen

## 2018-01-28 LAB — URINE CULTURE: Culture: >=100000 — AB

## 2018-01-29 ENCOUNTER — Telehealth (HOSPITAL_COMMUNITY): Payer: Self-pay | Admitting: Emergency Medicine

## 2018-01-29 ENCOUNTER — Encounter: Payer: Self-pay | Admitting: Osteopathic Medicine

## 2018-01-29 LAB — CERVICOVAGINAL ANCILLARY ONLY
Bacterial vaginitis: POSITIVE — AB
Candida vaginitis: NEGATIVE
Chlamydia: NEGATIVE
Neisseria Gonorrhea: NEGATIVE
Trichomonas: NEGATIVE

## 2018-01-29 MED ORDER — CEPHALEXIN 500 MG PO CAPS: 500.0000 mg | ORAL_CAPSULE | Freq: Two times a day (BID) | ORAL | 0 refills | Status: DC

## 2018-01-29 MED ORDER — FLUCONAZOLE 150 MG PO TABS: 150.0000 mg | ORAL_TABLET | Freq: Once | ORAL | 0 refills | Status: AC

## 2018-01-29 MED ORDER — METRONIDAZOLE 500 MG PO TABS: 500.0000 mg | ORAL_TABLET | Freq: Two times a day (BID) | ORAL | 0 refills | Status: DC

## 2018-01-29 MED FILL — CEPHALEXIN 500 MG CAPSULE: 500 | 5 days supply | Qty: 10 | Fill #0

## 2018-01-29 NOTE — Telephone Encounter (Signed)
Urine culture was positive for KLEBSIELLA PNEUMONIAE resistant to macrobid given at urgent care visit. Prescription for keflex per Dr. Tracie HarrierHagler sent to pharmacy of choice. Pt called and made aware. Pt educated to follow up if symptoms are not improving. Verbalized understanding.

## 2018-01-29 NOTE — Telephone Encounter (Signed)
Bacterial vaginosis is positive. This was not treated at the urgent care visit.  Flagyl 500 mg BID x 7 days #14 no refills sent to patients pharmacy of choice.  Pt requesting diflucan stating she gets yeast infections easily. Diflucan also sent to preferred pharmacy. All questions answered.

## 2018-01-30 DIAGNOSIS — F411 Generalized anxiety disorder: Secondary | ICD-10-CM | POA: Diagnosis not present

## 2018-01-30 MED FILL — metroNIDAZOLE 500 MG TABS: 500 | 7 days supply | Qty: 14 | Fill #0

## 2018-01-30 MED FILL — FLUCONAZOLE 150 MG TABS: 150 | 3 days supply | Qty: 2 | Fill #0

## 2018-01-31 MED FILL — ESCITALOPRAM 10 MG TABLET: 10 | 90 days supply | Qty: 90 | Fill #3

## 2018-02-27 DIAGNOSIS — F411 Generalized anxiety disorder: Secondary | ICD-10-CM | POA: Diagnosis not present

## 2018-03-19 MED FILL — TRI FEMYNOR 28 TABLET: 0.18/0.215/ | 84 days supply | Qty: 84 | Fill #3

## 2018-03-29 DIAGNOSIS — F411 Generalized anxiety disorder: Secondary | ICD-10-CM | POA: Diagnosis not present

## 2018-04-05 ENCOUNTER — Telehealth: Payer: Self-pay | Admitting: Osteopathic Medicine

## 2018-04-05 ENCOUNTER — Ambulatory Visit: Payer: 59 | Admitting: Osteopathic Medicine

## 2018-04-05 NOTE — Telephone Encounter (Signed)
Thanks for the update

## 2018-04-05 NOTE — Telephone Encounter (Signed)
Patient left a voicemail at 9:06am to cancel appointment. No further questions at this time.

## 2018-04-16 ENCOUNTER — Encounter: Payer: Self-pay | Admitting: Osteopathic Medicine

## 2018-04-16 ENCOUNTER — Other Ambulatory Visit: Payer: Self-pay | Admitting: Osteopathic Medicine

## 2018-04-17 NOTE — Telephone Encounter (Signed)
Pended a year of refills Can we confirm pharmacy and send rx? Thanks!

## 2018-04-18 ENCOUNTER — Other Ambulatory Visit: Payer: Self-pay | Admitting: Osteopathic Medicine

## 2018-04-18 ENCOUNTER — Encounter: Payer: Self-pay | Admitting: Osteopathic Medicine

## 2018-04-18 MED ORDER — ALPRAZOLAM 0.5 MG PO TABS: 0.2500 mg | ORAL_TABLET | Freq: Three times a day (TID) | ORAL | 0 refills | Status: DC | PRN

## 2018-04-18 MED FILL — ALPRAZolam 0.5 MG TABS: 0.5 | 10 days supply | Qty: 30 | Fill #0

## 2018-04-19 MED FILL — ESCITALOPRAM 10 MG TABLET: 10 | 90 days supply | Qty: 90 | Fill #0

## 2018-04-20 DIAGNOSIS — F411 Generalized anxiety disorder: Secondary | ICD-10-CM | POA: Diagnosis not present

## 2018-05-22 DIAGNOSIS — F411 Generalized anxiety disorder: Secondary | ICD-10-CM | POA: Diagnosis not present

## 2018-06-05 MED FILL — TRI FEMYNOR 28 TABLET: 0.18/0.215/ | 28 days supply | Qty: 28 | Fill #0

## 2018-07-02 MED FILL — TRI FEMYNOR 28 TABLET: 0.18/0.215/ | 28 days supply | Qty: 28 | Fill #0

## 2018-08-01 MED FILL — TRI FEMYNOR 28 TABLET: 0.18/0.215/ | 28 days supply | Qty: 28 | Fill #1

## 2018-08-14 ENCOUNTER — Other Ambulatory Visit: Payer: Self-pay | Admitting: Osteopathic Medicine

## 2018-08-15 MED FILL — ESCITALOPRAM 10 MG TABLET: 10 | 30 days supply | Qty: 30 | Fill #0

## 2018-09-10 MED FILL — TRI FEMYNOR 28 TABLET: 0.18/0.215/ | 84 days supply | Qty: 84 | Fill #0

## 2018-09-14 ENCOUNTER — Other Ambulatory Visit: Payer: Self-pay | Admitting: Osteopathic Medicine

## 2018-09-17 ENCOUNTER — Other Ambulatory Visit: Payer: Self-pay | Admitting: Osteopathic Medicine

## 2018-09-18 MED ORDER — ESCITALOPRAM OXALATE 10 MG PO TABS: 10.0000 mg | ORAL_TABLET | Freq: Every day | ORAL | 0 refills | Status: DC

## 2018-09-18 MED FILL — ESCITALOPRAM 10 MG TABLET: 10 | 15 days supply | Qty: 15 | Fill #0

## 2018-10-02 ENCOUNTER — Telehealth (INDEPENDENT_AMBULATORY_CARE_PROVIDER_SITE_OTHER): Payer: BC Managed Care – PPO | Admitting: Osteopathic Medicine

## 2018-10-02 ENCOUNTER — Encounter: Payer: Self-pay | Admitting: Osteopathic Medicine

## 2018-10-02 VITALS — BP 119/70 | HR 71 | Wt 130.0 lb

## 2018-10-02 DIAGNOSIS — Z0000 Encounter for general adult medical examination without abnormal findings: Secondary | ICD-10-CM

## 2018-10-02 MED ORDER — LORATADINE 10 MG PO TABS: 10.0000 mg | ORAL_TABLET | Freq: Every day | ORAL | 3 refills | Status: AC

## 2018-10-02 MED ORDER — ESCITALOPRAM OXALATE 10 MG PO TABS: 10.0000 mg | ORAL_TABLET | Freq: Every day | ORAL | 3 refills | Status: DC

## 2018-10-02 MED ORDER — NORGESTIM-ETH ESTRAD TRIPHASIC 0.18/0.215/0.25 MG-35 MCG PO TABS: 1.0000 | ORAL_TABLET | Freq: Every day | ORAL | 3 refills | Status: DC

## 2018-10-02 MED FILL — ESCITALOPRAM 10 MG TABLET: 10 | 90 days supply | Qty: 90 | Fill #0

## 2018-10-02 NOTE — Progress Notes (Signed)
Virtual Visit via Video (App used: Doximity) Note  I connected with      Monique Ellison on 10/02/18 at 8:40 AM by a telemedicine application and verified that I am speaking with the correct person using two identifiers.  Patient is at heom I am in office   I discussed the limitations of evaluation and management by telemedicine and the availability of in person appointments. The patient expressed understanding and agreed to proceed.  History of Present Illness: Monique Ellison is a 33 y.o. female who would like to discuss annual / refills      Patient appt for annual physical / wellness exam.  See preventive care reviewed as below.   Additional concerns today include:  None. Doing well on current meds, needs refills   Depression screen [facility] 2/9 10/02/2018 08/16/2017 03/15/2017  Decreased Interest 0 0 0  Down, Depressed, Hopeless 0 0 0  PHQ - 2 Score 0 0 0  Altered sleeping - 1 1  Tired, decreased energy - 1 0  Change in appetite - 0 0  Feeling bad or failure about yourself  - 0 0  Trouble concentrating - 0 0  Moving slowly or fidgety/restless - 0 1  Suicidal thoughts - 0 0  PHQ-9 Score - 2 2  Difficult doing work/chores - Not difficult at all -       Observations/Objective: BP 119/70 (Patient Position: Sitting, Cuff Size: Normal)   Pulse 71   Wt 130 lb (59 kg)   BMI 24.56 kg/m  BP Readings from Last 3 Encounters:  10/02/18 119/70  01/26/18 113/80  08/16/17 119/75   Exam: Normal Speech.  NAD  Lab and Radiology Results No results found for this or any previous visit (from the past 72 hour(s)). No results found.     Assessment and Plan: 33 y.o. female with The encounter diagnosis was Annual physical exam.   PDMP not reviewed this encounter. Orders Placed This Encounter  Procedures  . CBC  . COMPLETE METABOLIC PANEL WITH GFR  . Lipid panel   Meds ordered this encounter  Medications  . escitalopram (LEXAPRO) 10 MG tablet    Sig: Take 1  tablet (10 mg total) by mouth daily.    Dispense:  90 tablet    Refill:  3  . loratadine (CLARITIN) 10 MG tablet    Sig: Take 1 tablet (10 mg total) by mouth daily.    Dispense:  90 tablet    Refill:  3  . Norgestimate-Ethinyl Estradiol Triphasic (TRI-SPRINTEC) 0.18/0.215/0.25 MG-35 MCG tablet    Sig: Take 1 tablet by mouth daily.    Dispense:  3 Package    Refill:  3   Patient Instructions  General Preventive Care  Most recent routine screening lipids/other labs: ordered today .   Everyone should have blood pressure checked once per year.   Tobacco: don't!   Alcohol: responsible moderation is ok for most adults - if you have concerns about your alcohol intake, please talk to me!   Exercise: as tolerated to reduce risk of cardiovascular disease and diabetes. Strength training will also prevent osteoporosis.   Mental health: if need for mental health care (medicines, counseling, other), or concerns about moods, please let me know!   Sexual health: if need for STD testing, or if concerns with libido/pain problems, please let me know! If you need to discuss your birth control options, please let me know!   Advanced Directive: Living Will and/or Healthcare Power of  Attorney recommended for all adults, regardless of age or health.  Vaccines  Flu vaccine: recommended for almost everyone, every fall.   Shingles vaccine: Shingrix recommended after age 32.   Pneumonia vaccines: Prevnar and Pneumovax recommended after age 32, or sooner if certain medical conditions.  Tetanus booster: Tdap recommended every 10 years.   HPV vaccine: Gardasil recommended up to age 32 to prevent HPV-associated diseases, including certain cancers.  Cancer screenings   Colon cancer screening: recommended for everyone at age 32, but some folks need a colonoscopy sooner if risk factors   Breast cancer screening: mammogram recommended at age 32 every other year at least, and annually after age 32.    Cervical cancer screening: Pap every 1 to 5 years depending on age and other risk factors. Can usually stop at age 32 or w/ hysterectomy.   Lung cancer screening: not needed for non-smokers  Infection screenings . HIV: recommended screening at least once age 32-65, more often as needed. . Gonorrhea/Chlamydia: screening as needed, though many insurances "require"testing for anyone on birth control pills. . Hepatitis C: recommended for anyone born [id number] . TB: certain at-risk populations, or depending on work requirements and/or travel history Other . Bone Density Test: recommended for women at age 32    Instructions sent via MyChart. If MyChart not available, pt was given option for info via personal e-mail w/ no guarantee of protected health info over unsecured e-mail communication, and MyChart sign-up instructions were included.   Follow Up Instructions: Return in about 1 year (around 10/02/2019) for [place] (call week prior to visit for lab orders).    I discussed the assessment and treatment plan with the patient. The patient was provided an opportunity to ask questions and all were answered. The patient agreed with the plan and demonstrated an understanding of the instructions.   The patient was advised to call back or seek an in-person evaluation if any new concerns, if symptoms worsen or if the condition fails to improve as anticipated.  25 minutes of non-face-to-face time was provided during this encounter.                      Historical information moved to improve visibility of documentation.  Past Medical History:  Diagnosis Date  . Family history of heart disease 05/29/2015   Father had MI in 6840's  . Former smoker 05/29/2015   Quit 2008  . GERD (gastroesophageal reflux disease) 05/29/2015  . Seasonal allergies    Past Surgical History:  Procedure Laterality Date  . WISDOM TOOTH EXTRACTION     Social History   Tobacco Use  . Smoking status: Never  Smoker  . Smokeless tobacco: Never Used  Substance Use Topics  . Alcohol use: No   family history includes Depression in her brother; Diabetes in her maternal grandmother and mother; Heart attack in her father; Hyperlipidemia in her mother; Hypertension in her father; Stroke in her father and mother.  Medications: Current Outpatient Medications  Medication Sig Dispense Refill  . ALPRAZolam (XANAX) 0.5 MG tablet Take 0.5-1 tablets (0.25-0.5 mg total) by mouth 3 (three) times daily as needed for anxiety. 30 tablet 0  . escitalopram (LEXAPRO) 10 MG tablet Take 1 tablet (10 mg total) by mouth daily. 90 tablet 3  . loratadine (CLARITIN) 10 MG tablet Take 1 tablet (10 mg total) by mouth daily. 90 tablet 3  . Norgestimate-Ethinyl Estradiol Triphasic (TRI-SPRINTEC) 0.18/0.215/0.25 MG-35 MCG tablet Take 1 tablet by mouth daily. 3 Package  3   No current facility-administered medications for this visit.    No Known Allergies  PDMP not reviewed this encounter. Orders Placed This Encounter  Procedures  . CBC  . COMPLETE METABOLIC PANEL WITH GFR  . Lipid panel   Meds ordered this encounter  Medications  . escitalopram (LEXAPRO) 10 MG tablet    Sig: Take 1 tablet (10 mg total) by mouth daily.    Dispense:  90 tablet    Refill:  3  . loratadine (CLARITIN) 10 MG tablet    Sig: Take 1 tablet (10 mg total) by mouth daily.    Dispense:  90 tablet    Refill:  3  . Norgestimate-Ethinyl Estradiol Triphasic (TRI-SPRINTEC) 0.18/0.215/0.25 MG-35 MCG tablet    Sig: Take 1 tablet by mouth daily.    Dispense:  3 Package    Refill:  3

## 2018-10-02 NOTE — Patient Instructions (Signed)
General Preventive Care  Most recent routine screening lipids/other labs: ordered today .   Everyone should have blood pressure checked once per year.   Tobacco: don't!   Alcohol: responsible moderation is ok for most adults - if you have concerns about your alcohol intake, please talk to me!   Exercise: as tolerated to reduce risk of cardiovascular disease and diabetes. Strength training will also prevent osteoporosis.   Mental health: if need for mental health care (medicines, counseling, other), or concerns about moods, please let me know!   Sexual health: if need for STD testing, or if concerns with libido/pain problems, please let me know! If you need to discuss your birth control options, please let me know!   Advanced Directive: Living Will and/or Healthcare Power of Attorney recommended for all adults, regardless of age or health.  Vaccines  Flu vaccine: recommended for almost everyone, every fall.   Shingles vaccine: Shingrix recommended after age 27.   Pneumonia vaccines: Prevnar and Pneumovax recommended after age 17, or sooner if certain medical conditions.  Tetanus booster: Tdap recommended every 10 years.   HPV vaccine: Gardasil recommended up to age 73 to prevent HPV-associated diseases, including certain cancers.  Cancer screenings   Colon cancer screening: recommended for everyone at age 14, but some folks need a colonoscopy sooner if risk factors   Breast cancer screening: mammogram recommended at age 24 every other year at least, and annually after age 31.   Cervical cancer screening: Pap every 1 to 5 years depending on age and other risk factors. Can usually stop at age 57 or w/ hysterectomy.   Lung cancer screening: not needed for non-smokers  Infection screenings . HIV: recommended screening at least once age 64-65, more often as needed. . Gonorrhea/Chlamydia: screening as needed, though many insurances "require"testing for anyone on birth control  pills. . Hepatitis C: recommended for anyone born 90-1964 . TB: certain at-risk populations, or depending on work requirements and/or travel history Other . Bone Density Test: recommended for women at age 9

## 2018-11-20 DIAGNOSIS — Z01419 Encounter for gynecological examination (general) (routine) without abnormal findings: Secondary | ICD-10-CM | POA: Diagnosis not present

## 2018-11-20 DIAGNOSIS — Z6824 Body mass index (BMI) 24.0-24.9, adult: Secondary | ICD-10-CM | POA: Diagnosis not present

## 2018-11-20 LAB — HM PAP SMEAR: HM Pap smear: POSITIVE

## 2018-12-04 MED FILL — NORGESTIM-ETH ESTRAD TRIPHA: 0.18/0.215/ | 84 days supply | Qty: 84 | Fill #0

## 2018-12-13 DIAGNOSIS — R87611 Atypical squamous cells cannot exclude high grade squamous intraepithelial lesion on cytologic smear of cervix (ASC-H): Secondary | ICD-10-CM | POA: Diagnosis not present

## 2018-12-13 DIAGNOSIS — N871 Moderate cervical dysplasia: Secondary | ICD-10-CM | POA: Diagnosis not present

## 2018-12-13 DIAGNOSIS — R87610 Atypical squamous cells of undetermined significance on cytologic smear of cervix (ASC-US): Secondary | ICD-10-CM | POA: Diagnosis not present

## 2018-12-13 DIAGNOSIS — Z3202 Encounter for pregnancy test, result negative: Secondary | ICD-10-CM | POA: Diagnosis not present

## 2018-12-13 DIAGNOSIS — R87810 Cervical high risk human papillomavirus (HPV) DNA test positive: Secondary | ICD-10-CM | POA: Diagnosis not present

## 2019-01-03 ENCOUNTER — Encounter: Payer: Self-pay | Admitting: Osteopathic Medicine

## 2019-01-04 MED FILL — ESCITALOPRAM 10 MG TABLET: 10 | 90 days supply | Qty: 90 | Fill #1

## 2019-02-11 ENCOUNTER — Encounter: Payer: Self-pay | Admitting: Osteopathic Medicine

## 2019-02-11 MED FILL — ESCITALOPRAM 10 MG TABLET: 10 | 30 days supply | Qty: 30 | Fill #2

## 2019-03-19 MED FILL — ESCITALOPRAM 10 MG TABLET: 10 | 30 days supply | Qty: 30 | Fill #3

## 2019-03-19 MED FILL — NORGESTIM-ETH ESTRAD TRIPHA: 0.18/0.215/ | 84 days supply | Qty: 84 | Fill #1

## 2019-03-26 DIAGNOSIS — R87613 High grade squamous intraepithelial lesion on cytologic smear of cervix (HGSIL): Secondary | ICD-10-CM | POA: Diagnosis not present

## 2019-03-26 DIAGNOSIS — N871 Moderate cervical dysplasia: Secondary | ICD-10-CM | POA: Diagnosis not present

## 2019-03-27 ENCOUNTER — Ambulatory Visit (HOSPITAL_COMMUNITY)
Admission: EM | Admit: 2019-03-27 | Discharge: 2019-03-27 | Disposition: A | Discharge status: Home / Self Care | Payer: BC Managed Care – PPO | Attending: Family Medicine | Admitting: Family Medicine

## 2019-03-27 ENCOUNTER — Other Ambulatory Visit: Payer: Self-pay

## 2019-03-27 ENCOUNTER — Encounter (HOSPITAL_COMMUNITY): Payer: Self-pay

## 2019-03-27 DIAGNOSIS — R220 Localized swelling, mass and lump, head: Secondary | ICD-10-CM | POA: Diagnosis not present

## 2019-03-27 MED ORDER — PREDNISONE 10 MG (21) PO TBPK: ORAL_TABLET | Freq: Every day | ORAL | 0 refills | Status: DC

## 2019-03-27 NOTE — ED Triage Notes (Signed)
Pt state she has swelling in her right side of her face. Pt states she felt her face tingling and swelling around 4 pm today.

## 2019-03-28 NOTE — ED Provider Notes (Signed)
[facility]   [id number] 03/27/19 Arrival Time: 1950  ASSESSMENT & PLAN:  1. Right facial swelling     Will watch overnight and start below if needed. Question etiology. No signs of infection. Discussed.  Meds ordered this encounter  Medications  . predniSONE (STERAPRED UNI-PAK 21 TAB) 10 MG (21) TBPK tablet    Sig: Take by mouth daily. Take as directed.    Dispense:  21 tablet    Refill:  0    May f/u with PCP or here as needed.  Reviewed expectations re: course of current medical issues. Questions answered. Outlined signs and symptoms indicating need for more acute intervention. Patient verbalized understanding. After Visit Summary given.   SUBJECTIVE: History from: patient.  Monique Ellison is a 34 y.o. female who presents with complaint of "feeling like my face is a little swollen". Noticed this evening. Around lower jaw in front of ear. No pain. Afebrile. No recent illnesses. Questions slight "tingling feeling". No extremity sensation changes or weakness. Ambulatory without difficulty. No new medications. No specific aggravating or alleviating factors reported.    Social History   Tobacco Use  Smoking Status Never Smoker  Smokeless Tobacco Never Used     OBJECTIVE:  Vitals:   03/27/19 2002 03/27/19 2004  BP:  115/73  Pulse:  65  Resp:  16  Temp:  98.3 F (36.8 C)  TempSrc:  Oral  SpO2:  100%  Weight: 59 kg      General appearance: alert; NAD HEENT: without nasal congestion; oral mucosa appears normal; no tonsil swelling; slight swelling around TMJ but without pain and without overlying erythema; teeth appear normal Neck: supple without LAD CV: RRR Lungs: unlabored respirations Skin: warm and dry Psychological: alert and cooperative; normal mood and affect   No Known Allergies  Past Medical History:  Diagnosis Date  . Family history of heart disease 05/29/2015   Father had MI in 36's  . Former smoker 05/29/2015   Quit 2008  . GERD  (gastroesophageal reflux disease) 05/29/2015  . Seasonal allergies    Family History  Problem Relation Age of Onset  . Diabetes Mother   . Hyperlipidemia Mother   . Stroke Mother   . Hypertension Father   . Stroke Father   . Heart attack Father   . Depression Brother   . Diabetes Maternal Grandmother    Social History   Socioeconomic History  . Marital status: Married    Spouse name: Not on file  . Number of children: Not on file  . Years of education: Not on file  . Highest education level: Not on file  Occupational History  . Not on file  Tobacco Use  . Smoking status: Never Smoker  . Smokeless tobacco: Never Used  Substance and Sexual Activity  . Alcohol use: No  . Drug use: Not on file  . Sexual activity: Not on file  Other Topics Concern  . Not on file  Social History Narrative  . Not on file   Social Determinants of Health   Financial Resource Strain:   . Difficulty of Paying Living Expenses: Not on file  Food Insecurity:   . Worried About Programme researcher, broadcasting/film/video in the Last Year: Not on file  . Ran Out of Food in the Last Year: Not on file  Transportation Needs:   . Lack of Transportation (Medical): Not on file  . Lack of Transportation (Non-Medical): Not on file  Physical Activity:   . Days of  Exercise per Week: Not on file  . Minutes of Exercise per Session: Not on file  Stress:   . Feeling of Stress : Not on file  Social Connections:   . Frequency of Communication with Friends and Family: Not on file  . Frequency of Social Gatherings with Friends and Family: Not on file  . Attends Religious Services: Not on file  . Active Member of Clubs or Organizations: Not on file  . Attends Archivist Meetings: Not on file  . Marital Status: Not on file  Intimate Partner Violence:   . Fear of Current or Ex-Partner: Not on file  . Emotionally Abused: Not on file  . Physically Abused: Not on file  . Sexually Abused: Not on file           Vanessa Kick, MD 03/28/19 [phone]

## 2019-04-19 MED FILL — ESCITALOPRAM 10 MG TABLET: 10 | 30 days supply | Qty: 30 | Fill #4

## 2019-04-25 DIAGNOSIS — D485 Neoplasm of uncertain behavior of skin: Secondary | ICD-10-CM | POA: Diagnosis not present

## 2019-04-28 ENCOUNTER — Encounter: Payer: Self-pay | Admitting: Osteopathic Medicine

## 2019-04-29 NOTE — Telephone Encounter (Signed)
Forwarding to provider for recommendation.   

## 2019-05-22 MED FILL — ESCITALOPRAM 10 MG TABLET: 10 | 30 days supply | Qty: 30 | Fill #5

## 2019-06-18 MED FILL — NORGESTIM-ETH ESTRAD TRIPHA: 0.18/0.215/ | 84 days supply | Qty: 84 | Fill #2

## 2019-06-26 ENCOUNTER — Other Ambulatory Visit: Payer: Self-pay | Admitting: Osteopathic Medicine

## 2019-06-26 ENCOUNTER — Encounter: Payer: Self-pay | Admitting: Osteopathic Medicine

## 2019-07-17 DIAGNOSIS — M5441 Lumbago with sciatica, right side: Secondary | ICD-10-CM | POA: Diagnosis not present

## 2019-07-17 DIAGNOSIS — M9903 Segmental and somatic dysfunction of lumbar region: Secondary | ICD-10-CM | POA: Diagnosis not present

## 2019-07-17 DIAGNOSIS — M9904 Segmental and somatic dysfunction of sacral region: Secondary | ICD-10-CM | POA: Diagnosis not present

## 2019-07-17 DIAGNOSIS — M9901 Segmental and somatic dysfunction of cervical region: Secondary | ICD-10-CM | POA: Diagnosis not present

## 2019-07-23 DIAGNOSIS — M9904 Segmental and somatic dysfunction of sacral region: Secondary | ICD-10-CM | POA: Diagnosis not present

## 2019-07-23 DIAGNOSIS — M9901 Segmental and somatic dysfunction of cervical region: Secondary | ICD-10-CM | POA: Diagnosis not present

## 2019-07-23 DIAGNOSIS — M5441 Lumbago with sciatica, right side: Secondary | ICD-10-CM | POA: Diagnosis not present

## 2019-07-23 DIAGNOSIS — M9903 Segmental and somatic dysfunction of lumbar region: Secondary | ICD-10-CM | POA: Diagnosis not present

## 2019-07-23 MED FILL — ESCITALOPRAM 10 MG TABLET: 10 | 30 days supply | Qty: 30 | Fill #6

## 2019-07-24 DIAGNOSIS — M5441 Lumbago with sciatica, right side: Secondary | ICD-10-CM | POA: Diagnosis not present

## 2019-07-24 DIAGNOSIS — M9901 Segmental and somatic dysfunction of cervical region: Secondary | ICD-10-CM | POA: Diagnosis not present

## 2019-07-24 DIAGNOSIS — M9904 Segmental and somatic dysfunction of sacral region: Secondary | ICD-10-CM | POA: Diagnosis not present

## 2019-07-24 DIAGNOSIS — M9903 Segmental and somatic dysfunction of lumbar region: Secondary | ICD-10-CM | POA: Diagnosis not present

## 2019-07-26 ENCOUNTER — Encounter: Payer: Self-pay | Admitting: Osteopathic Medicine

## 2019-07-26 ENCOUNTER — Ambulatory Visit (INDEPENDENT_AMBULATORY_CARE_PROVIDER_SITE_OTHER): Payer: BC Managed Care – PPO | Admitting: Osteopathic Medicine

## 2019-07-26 VITALS — BP 106/73 | HR 80 | Wt 133.0 lb

## 2019-07-26 DIAGNOSIS — M545 Low back pain, unspecified: Secondary | ICD-10-CM

## 2019-07-26 DIAGNOSIS — R21 Rash and other nonspecific skin eruption: Secondary | ICD-10-CM

## 2019-07-26 MED ORDER — PREDNISONE 20 MG PO TABS: 20.0000 mg | ORAL_TABLET | Freq: Two times a day (BID) | ORAL | 0 refills | Status: DC

## 2019-07-26 MED ORDER — CYCLOBENZAPRINE HCL 10 MG PO TABS: 5.0000 mg | ORAL_TABLET | Freq: Three times a day (TID) | ORAL | 1 refills | Status: DC | PRN

## 2019-07-26 MED FILL — predniSONE 20 MG TABS: 20 | 5 days supply | Qty: 10 | Fill #0

## 2019-07-26 MED FILL — CYCLOBENZAPRINE HCL 10 MG T: 10 | 20 days supply | Qty: 60 | Fill #0

## 2019-07-26 NOTE — Progress Notes (Signed)
Monique Ellison is a 34 y.o. female who presents to  [facility] at [facility]  today, 07/26/19, seeking care for the following: . R lower back pain - flares up intermittently was worse this past few weeks, easing up now w/ a few chiro visit . Rash on L arm, not bothering her just wants it looked at      ASSESSMENT & PLAN with other pertinent history/findings:  The primary encounter diagnosis was Low back pain radiating to right lower extremity. A diagnosis of Rash was also pertinent to this visit.   1. Low back pain radiating to right lower extremity  On exam, tendern point over R SI joint, OMT: FPR and MFR (+)relief  Meds ordered this encounter  Medications  . cyclobenzaprine (FLEXERIL) 10 MG tablet    Sig: Take 0.5-1 tablets (5-10 mg total) by mouth 3 (three) times daily as needed for muscle spasms. Caution: can cause drowsiness    Dispense:  60 tablet    Refill:  1  . predniSONE (DELTASONE) 20 MG tablet    Sig: Take 1 tablet (20 mg total) by mouth 2 (two) times daily with a meal.    Dispense:  10 tablet    Refill:  0  PT & XR/MRI if no better    2. Rash  Seems like bite of some kind, not itching, watch this and if target lesion develops let m eknow, if itching or other symptoms develop let me know         Follow-up instructions: Return if symptoms worsen or fail to improve.                                         BP 106/73 (BP Location: Left Arm, Patient Position: Sitting)   Pulse 80   Wt 133 lb (60.3 kg)   BMI 25.13 kg/m   Current Meds  Medication Sig  . ALPRAZolam (XANAX) 0.5 MG tablet Take 0.5-1 tablets (0.25-0.5 mg total) by mouth 3 (three) times daily as needed for anxiety.  Marland Kitchen escitalopram (LEXAPRO) 10 MG tablet Take 1 tablet (10 mg total) by mouth daily. (Patient taking differently: Take 5 mg by mouth daily. )  . loratadine (CLARITIN) 10 MG tablet Take 1 tablet (10 mg  total) by mouth daily.  . Norgestimate-Ethinyl Estradiol Triphasic (TRI-SPRINTEC) 0.18/0.215/0.25 MG-35 MCG tablet Take 1 tablet by mouth daily.    No results found for this or any previous visit (from the past 72 hour(s)).  No results found.  Depression screen [facility] 2/9 10/02/2018 08/16/2017 03/15/2017  Decreased Interest 0 0 0  Down, Depressed, Hopeless 0 0 0  PHQ - 2 Score 0 0 0  Altered sleeping - 1 1  Tired, decreased energy - 1 0  Change in appetite - 0 0  Feeling bad or failure about yourself  - 0 0  Trouble concentrating - 0 0  Moving slowly or fidgety/restless - 0 1  Suicidal thoughts - 0 0  PHQ-9 Score - 2 2  Difficult doing work/chores - Not difficult at all -    GAD 7 : Generalized Anxiety Score 10/02/2018 08/16/2017 03/15/2017  Nervous, Anxious, on Edge 1 1 2   Control/stop worrying 1 0 1  Worry too much - different things 0 0 1  Trouble relaxing 1 0 1  Restless 0 0 1  Easily annoyed or irritable 0  0 0  Afraid - awful might happen 0 0 0  Total GAD 7 Score 3 1 6   Anxiety Difficulty Not difficult at all Not difficult at all -      All questions at time of visit were answered - patient instructed to contact office with any additional concerns or updates.  ER/RTC precautions were reviewed with the patient.  Please note: voice recognition software was used to produce this document, and typos may escape review. Please contact Dr. Sheppard Coil for any needed clarifications.

## 2019-07-30 DIAGNOSIS — M9904 Segmental and somatic dysfunction of sacral region: Secondary | ICD-10-CM | POA: Diagnosis not present

## 2019-07-30 DIAGNOSIS — M5441 Lumbago with sciatica, right side: Secondary | ICD-10-CM | POA: Diagnosis not present

## 2019-07-30 DIAGNOSIS — M9901 Segmental and somatic dysfunction of cervical region: Secondary | ICD-10-CM | POA: Diagnosis not present

## 2019-07-30 DIAGNOSIS — M9903 Segmental and somatic dysfunction of lumbar region: Secondary | ICD-10-CM | POA: Diagnosis not present

## 2019-07-31 DIAGNOSIS — M9903 Segmental and somatic dysfunction of lumbar region: Secondary | ICD-10-CM | POA: Diagnosis not present

## 2019-07-31 DIAGNOSIS — M5441 Lumbago with sciatica, right side: Secondary | ICD-10-CM | POA: Diagnosis not present

## 2019-07-31 DIAGNOSIS — M9901 Segmental and somatic dysfunction of cervical region: Secondary | ICD-10-CM | POA: Diagnosis not present

## 2019-07-31 DIAGNOSIS — M9904 Segmental and somatic dysfunction of sacral region: Secondary | ICD-10-CM | POA: Diagnosis not present

## 2019-08-05 DIAGNOSIS — M5441 Lumbago with sciatica, right side: Secondary | ICD-10-CM | POA: Diagnosis not present

## 2019-08-05 DIAGNOSIS — M9901 Segmental and somatic dysfunction of cervical region: Secondary | ICD-10-CM | POA: Diagnosis not present

## 2019-08-05 DIAGNOSIS — M9903 Segmental and somatic dysfunction of lumbar region: Secondary | ICD-10-CM | POA: Diagnosis not present

## 2019-08-05 DIAGNOSIS — M9904 Segmental and somatic dysfunction of sacral region: Secondary | ICD-10-CM | POA: Diagnosis not present

## 2019-08-06 DIAGNOSIS — M9904 Segmental and somatic dysfunction of sacral region: Secondary | ICD-10-CM | POA: Diagnosis not present

## 2019-08-06 DIAGNOSIS — M5441 Lumbago with sciatica, right side: Secondary | ICD-10-CM | POA: Diagnosis not present

## 2019-08-06 DIAGNOSIS — M9901 Segmental and somatic dysfunction of cervical region: Secondary | ICD-10-CM | POA: Diagnosis not present

## 2019-08-06 DIAGNOSIS — M9903 Segmental and somatic dysfunction of lumbar region: Secondary | ICD-10-CM | POA: Diagnosis not present

## 2019-08-07 DIAGNOSIS — M9903 Segmental and somatic dysfunction of lumbar region: Secondary | ICD-10-CM | POA: Diagnosis not present

## 2019-08-07 DIAGNOSIS — M9901 Segmental and somatic dysfunction of cervical region: Secondary | ICD-10-CM | POA: Diagnosis not present

## 2019-08-07 DIAGNOSIS — M9904 Segmental and somatic dysfunction of sacral region: Secondary | ICD-10-CM | POA: Diagnosis not present

## 2019-08-07 DIAGNOSIS — M5441 Lumbago with sciatica, right side: Secondary | ICD-10-CM | POA: Diagnosis not present

## 2019-08-12 DIAGNOSIS — M9903 Segmental and somatic dysfunction of lumbar region: Secondary | ICD-10-CM | POA: Diagnosis not present

## 2019-08-12 DIAGNOSIS — M9901 Segmental and somatic dysfunction of cervical region: Secondary | ICD-10-CM | POA: Diagnosis not present

## 2019-08-12 DIAGNOSIS — M5441 Lumbago with sciatica, right side: Secondary | ICD-10-CM | POA: Diagnosis not present

## 2019-08-12 DIAGNOSIS — M9904 Segmental and somatic dysfunction of sacral region: Secondary | ICD-10-CM | POA: Diagnosis not present

## 2019-08-13 DIAGNOSIS — M9901 Segmental and somatic dysfunction of cervical region: Secondary | ICD-10-CM | POA: Diagnosis not present

## 2019-08-13 DIAGNOSIS — M9903 Segmental and somatic dysfunction of lumbar region: Secondary | ICD-10-CM | POA: Diagnosis not present

## 2019-08-13 DIAGNOSIS — M5441 Lumbago with sciatica, right side: Secondary | ICD-10-CM | POA: Diagnosis not present

## 2019-08-13 DIAGNOSIS — M9904 Segmental and somatic dysfunction of sacral region: Secondary | ICD-10-CM | POA: Diagnosis not present

## 2019-08-14 DIAGNOSIS — M9901 Segmental and somatic dysfunction of cervical region: Secondary | ICD-10-CM | POA: Diagnosis not present

## 2019-08-14 DIAGNOSIS — M9904 Segmental and somatic dysfunction of sacral region: Secondary | ICD-10-CM | POA: Diagnosis not present

## 2019-08-14 DIAGNOSIS — M9903 Segmental and somatic dysfunction of lumbar region: Secondary | ICD-10-CM | POA: Diagnosis not present

## 2019-08-14 DIAGNOSIS — M5441 Lumbago with sciatica, right side: Secondary | ICD-10-CM | POA: Diagnosis not present

## 2019-08-19 DIAGNOSIS — M9904 Segmental and somatic dysfunction of sacral region: Secondary | ICD-10-CM | POA: Diagnosis not present

## 2019-08-19 DIAGNOSIS — M9903 Segmental and somatic dysfunction of lumbar region: Secondary | ICD-10-CM | POA: Diagnosis not present

## 2019-08-19 DIAGNOSIS — M5441 Lumbago with sciatica, right side: Secondary | ICD-10-CM | POA: Diagnosis not present

## 2019-08-19 DIAGNOSIS — M9901 Segmental and somatic dysfunction of cervical region: Secondary | ICD-10-CM | POA: Diagnosis not present

## 2019-08-20 DIAGNOSIS — M9904 Segmental and somatic dysfunction of sacral region: Secondary | ICD-10-CM | POA: Diagnosis not present

## 2019-08-20 DIAGNOSIS — M5441 Lumbago with sciatica, right side: Secondary | ICD-10-CM | POA: Diagnosis not present

## 2019-08-20 DIAGNOSIS — M9901 Segmental and somatic dysfunction of cervical region: Secondary | ICD-10-CM | POA: Diagnosis not present

## 2019-08-20 DIAGNOSIS — M9903 Segmental and somatic dysfunction of lumbar region: Secondary | ICD-10-CM | POA: Diagnosis not present

## 2019-08-21 DIAGNOSIS — M9901 Segmental and somatic dysfunction of cervical region: Secondary | ICD-10-CM | POA: Diagnosis not present

## 2019-08-21 DIAGNOSIS — M5441 Lumbago with sciatica, right side: Secondary | ICD-10-CM | POA: Diagnosis not present

## 2019-08-21 DIAGNOSIS — M9904 Segmental and somatic dysfunction of sacral region: Secondary | ICD-10-CM | POA: Diagnosis not present

## 2019-08-21 DIAGNOSIS — M9903 Segmental and somatic dysfunction of lumbar region: Secondary | ICD-10-CM | POA: Diagnosis not present

## 2019-09-03 DIAGNOSIS — M9903 Segmental and somatic dysfunction of lumbar region: Secondary | ICD-10-CM | POA: Diagnosis not present

## 2019-09-03 DIAGNOSIS — M9904 Segmental and somatic dysfunction of sacral region: Secondary | ICD-10-CM | POA: Diagnosis not present

## 2019-09-03 DIAGNOSIS — M9901 Segmental and somatic dysfunction of cervical region: Secondary | ICD-10-CM | POA: Diagnosis not present

## 2019-09-03 DIAGNOSIS — M5441 Lumbago with sciatica, right side: Secondary | ICD-10-CM | POA: Diagnosis not present

## 2019-09-04 DIAGNOSIS — M5441 Lumbago with sciatica, right side: Secondary | ICD-10-CM | POA: Diagnosis not present

## 2019-09-04 DIAGNOSIS — M9901 Segmental and somatic dysfunction of cervical region: Secondary | ICD-10-CM | POA: Diagnosis not present

## 2019-09-04 DIAGNOSIS — M9904 Segmental and somatic dysfunction of sacral region: Secondary | ICD-10-CM | POA: Diagnosis not present

## 2019-09-04 DIAGNOSIS — M9903 Segmental and somatic dysfunction of lumbar region: Secondary | ICD-10-CM | POA: Diagnosis not present

## 2019-09-09 DIAGNOSIS — M5441 Lumbago with sciatica, right side: Secondary | ICD-10-CM | POA: Diagnosis not present

## 2019-09-09 DIAGNOSIS — M9903 Segmental and somatic dysfunction of lumbar region: Secondary | ICD-10-CM | POA: Diagnosis not present

## 2019-09-09 DIAGNOSIS — M9901 Segmental and somatic dysfunction of cervical region: Secondary | ICD-10-CM | POA: Diagnosis not present

## 2019-09-09 DIAGNOSIS — M9904 Segmental and somatic dysfunction of sacral region: Secondary | ICD-10-CM | POA: Diagnosis not present

## 2019-09-11 DIAGNOSIS — M9904 Segmental and somatic dysfunction of sacral region: Secondary | ICD-10-CM | POA: Diagnosis not present

## 2019-09-11 DIAGNOSIS — M5441 Lumbago with sciatica, right side: Secondary | ICD-10-CM | POA: Diagnosis not present

## 2019-09-11 DIAGNOSIS — M9903 Segmental and somatic dysfunction of lumbar region: Secondary | ICD-10-CM | POA: Diagnosis not present

## 2019-09-11 DIAGNOSIS — M9901 Segmental and somatic dysfunction of cervical region: Secondary | ICD-10-CM | POA: Diagnosis not present

## 2019-09-11 MED FILL — NORGESTIM-ETH ESTRAD TRIPHA: 0.18/0.215/ | 84 days supply | Qty: 84 | Fill #3

## 2019-09-17 DIAGNOSIS — M9901 Segmental and somatic dysfunction of cervical region: Secondary | ICD-10-CM | POA: Diagnosis not present

## 2019-09-17 DIAGNOSIS — M9903 Segmental and somatic dysfunction of lumbar region: Secondary | ICD-10-CM | POA: Diagnosis not present

## 2019-09-17 DIAGNOSIS — M9904 Segmental and somatic dysfunction of sacral region: Secondary | ICD-10-CM | POA: Diagnosis not present

## 2019-09-17 DIAGNOSIS — M5441 Lumbago with sciatica, right side: Secondary | ICD-10-CM | POA: Diagnosis not present

## 2019-09-19 ENCOUNTER — Other Ambulatory Visit: Payer: Self-pay

## 2019-09-19 ENCOUNTER — Encounter: Payer: Self-pay | Admitting: Osteopathic Medicine

## 2019-09-19 MED ORDER — ESCITALOPRAM OXALATE 10 MG PO TABS: 10.0000 mg | ORAL_TABLET | Freq: Every day | ORAL | 0 refills | Status: DC

## 2019-09-19 MED FILL — ESCITALOPRAM 10 MG TABLET: 10 | 90 days supply | Qty: 90 | Fill #0

## 2019-09-26 ENCOUNTER — Encounter: Payer: Self-pay | Admitting: Osteopathic Medicine

## 2019-09-30 DIAGNOSIS — M9904 Segmental and somatic dysfunction of sacral region: Secondary | ICD-10-CM | POA: Diagnosis not present

## 2019-09-30 DIAGNOSIS — M9903 Segmental and somatic dysfunction of lumbar region: Secondary | ICD-10-CM | POA: Diagnosis not present

## 2019-09-30 DIAGNOSIS — M5441 Lumbago with sciatica, right side: Secondary | ICD-10-CM | POA: Diagnosis not present

## 2019-09-30 DIAGNOSIS — M9901 Segmental and somatic dysfunction of cervical region: Secondary | ICD-10-CM | POA: Diagnosis not present

## 2019-10-07 DIAGNOSIS — M9904 Segmental and somatic dysfunction of sacral region: Secondary | ICD-10-CM | POA: Diagnosis not present

## 2019-10-07 DIAGNOSIS — M5441 Lumbago with sciatica, right side: Secondary | ICD-10-CM | POA: Diagnosis not present

## 2019-10-07 DIAGNOSIS — M9901 Segmental and somatic dysfunction of cervical region: Secondary | ICD-10-CM | POA: Diagnosis not present

## 2019-10-07 DIAGNOSIS — M9903 Segmental and somatic dysfunction of lumbar region: Secondary | ICD-10-CM | POA: Diagnosis not present

## 2019-10-09 DIAGNOSIS — M9903 Segmental and somatic dysfunction of lumbar region: Secondary | ICD-10-CM | POA: Diagnosis not present

## 2019-10-09 DIAGNOSIS — M9904 Segmental and somatic dysfunction of sacral region: Secondary | ICD-10-CM | POA: Diagnosis not present

## 2019-10-09 DIAGNOSIS — M9901 Segmental and somatic dysfunction of cervical region: Secondary | ICD-10-CM | POA: Diagnosis not present

## 2019-10-09 DIAGNOSIS — M5441 Lumbago with sciatica, right side: Secondary | ICD-10-CM | POA: Diagnosis not present

## 2019-10-23 DIAGNOSIS — Z20822 Contact with and (suspected) exposure to covid-19: Secondary | ICD-10-CM | POA: Diagnosis not present

## 2019-10-23 DIAGNOSIS — Z03818 Encounter for observation for suspected exposure to other biological agents ruled out: Secondary | ICD-10-CM | POA: Diagnosis not present

## 2019-11-06 MED FILL — CYCLOBENZAPRINE HCL 10 MG T: 10 | 20 days supply | Qty: 60 | Fill #1

## 2019-11-11 ENCOUNTER — Other Ambulatory Visit: Payer: Self-pay

## 2019-11-11 ENCOUNTER — Encounter: Payer: Self-pay | Admitting: Osteopathic Medicine

## 2019-11-11 ENCOUNTER — Ambulatory Visit (INDEPENDENT_AMBULATORY_CARE_PROVIDER_SITE_OTHER): Payer: BC Managed Care – PPO | Admitting: Osteopathic Medicine

## 2019-11-11 VITALS — BP 111/78 | HR 75 | Ht 61.0 in | Wt 138.0 lb

## 2019-11-11 DIAGNOSIS — Z0000 Encounter for general adult medical examination without abnormal findings: Secondary | ICD-10-CM

## 2019-11-11 NOTE — Patient Instructions (Signed)
General Preventive Care  Most recent routine screening labs: ordered today.   Blood pressure goal 130/80 or less.   Tobacco: don't!   Alcohol: responsible moderation is ok for most adults - if you have concerns about your alcohol intake, please talk to me!   Exercise: as tolerated to reduce risk of cardiovascular disease and diabetes. Strength training will also prevent osteoporosis.   Mental health: if need for mental health care (medicines, counseling, other), or concerns about moods, please let me know!   Sexual / Reproductive health: if need for STD testing, if concerns with libido/pain problems, if need to discuss family planning, please let me or OBGYN know!   Advanced Directive: Living Will and/or Healthcare Power of Attorney recommended for all adults, regardless of age or health.  Vaccines  Flu vaccine: for almost everyone, every fall.   Shingles vaccine: after age 84.   Pneumonia vaccines: after age 29  Tetanus booster: every 10 years / 3rd trimester of pregnancy  HPV vaccine: Gardasil up to age 79   COVID vaccine: THANKS for getting your vaccine! :)  Cancer screenings   Colon cancer screening: for everyone age 93-75.   Breast cancer screening: mammogram at age 87 every other year at least, and annually after age 74.   Cervical cancer screening: Pap every 1 to 5 years depending on age and other risk factors. Can usually stop at age 79 or w/ hysterectomy.   Lung cancer screening: not needed for non-smokers  Infection screenings   HIV: recommended screening at least once age 39-65  Gonorrhea/Chlamydia: screening as needed  Hepatitis C: recommended once for everyone age 31-75  TB: certain at-risk populations, or depending on work requirements and/or travel history Other  Bone Density Test: recommended for women at age 100

## 2019-11-11 NOTE — Progress Notes (Signed)
HPI: Monique Ellison is a 34 y.o. female who  has a past medical history of Family history of heart disease (05/29/2015), Former smoker (05/29/2015), GERD (gastroesophageal reflux disease) (05/29/2015), and Seasonal allergies.  she presents to [facility] today, 11/11/19,  for chief complaint of: Annual physical  Patient reports she is generally doing well since her last visit. She works about 1 day per week in the [employer]. She states her mental health has been doing well and she has decreased her lexapro to 5mg  per day. She has no concerns, questions, or symptoms, including fever, chills, headache, vision changes, chest pain, SOB, abdominal pain, urinary changes.  Past medical, surgical, social and family history reviewed:  Patient Active Problem List   Diagnosis Date Noted   Family history of heart disease 08/16/2017   Anxiety 02/23/2017   Tension headache 08/22/2016   GERD (gastroesophageal reflux disease) 05/29/2015   Former smoker 05/29/2015    Past Surgical History:  Procedure Laterality Date   WISDOM TOOTH EXTRACTION      Social History   Tobacco Use   Smoking status: Never Smoker   Smokeless tobacco: Never Used  Substance Use Topics   Alcohol use: No    Family History  Problem Relation Age of Onset   Diabetes Mother    Hyperlipidemia Mother    Stroke Mother    Hypertension Father    Stroke Father    Heart attack Father    Depression Brother    Diabetes Maternal Grandmother      Current medication list and allergy/intolerance information reviewed:    Current Outpatient Medications  Medication Sig Dispense Refill   ALPRAZolam (XANAX) 0.5 MG tablet Take 0.5-1 tablets (0.25-0.5 mg total) by mouth 3 (three) times daily as needed for anxiety. 30 tablet 0   cyclobenzaprine (FLEXERIL) 10 MG tablet Take 0.5-1 tablets (5-10 mg total) by mouth 3 (three) times daily as needed for muscle spasms. Caution: can  cause drowsiness 60 tablet 1   escitalopram (LEXAPRO) 10 MG tablet Take 1 tablet (10 mg total) by mouth daily. 90 tablet 0   loratadine (CLARITIN) 10 MG tablet Take 1 tablet (10 mg total) by mouth daily. 90 tablet 3   Norgestimate-Ethinyl Estradiol Triphasic (TRI-SPRINTEC) 0.18/0.215/0.25 MG-35 MCG tablet Take 1 tablet by mouth daily. 3 Package 3   predniSONE (DELTASONE) 20 MG tablet Take 1 tablet (20 mg total) by mouth 2 (two) times daily with a meal. (Patient not taking: Reported on 11/11/2019) 10 tablet 0   No current facility-administered medications for this visit.    No Known Allergies    Review of Systems:  Constitutional:  No  fever, no chills  HEENT: No  headache, no vision change  Cardiac: No  chest pain,  Respiratory:  No  shortness of breath. No  Cough  Gastrointestinal: No  abdominal pain, No  nausea, No  vomiting  Musculoskeletal: No new myalgia/arthralgia  Skin: No  Rash, No other wounds/concerning lesions  Neurologic: No  weakness, No  dizziness  Psychiatric: No  concerns with depression, No  concerns with anxiety  Exam:  BP 111/78 (BP Location: Left Arm, Patient Position: Sitting)    Pulse 75    Ht 5\' 1"  (1.549 m)    Wt 138 lb (62.6 kg)    SpO2 99%    BMI 26.07 kg/m   Constitutional: VS see above. General Appearance: alert, well-developed, well-nourished, NAD  Eyes: Normal lids and conjunctive, non-icteric sclera   Neck: No  masses, trachea midline.   Respiratory: Normal respiratory effort. no wheeze, no rhonchi, no rales  Cardiovascular: S1/S2 normal, no murmur, no rub/gallop auscultated. RRR.   Gastrointestinal: Nontender, no masses.   Musculoskeletal: Gait normal. No clubbing/cyanosis of digits.   Neurological: Normal balance/coordination. No tremor. No cranial nerve deficit on limited exam.  Skin: warm, dry, intact. No rash/ulcer. No concerning nevi or subq nodules on limited exam.  Psychiatric: Normal judgment/insight. Normal mood and  affect. Oriented x3.    No results found for this or any previous visit (from the past 72 hour(s)).  No results found.   ASSESSMENT/PLAN: The encounter diagnosis was Annual physical exam.   Routine Health Maintenance  Up to date on screenings and vaccinations. Pap smears done through outside OB-Gyn.  Ordered routine CBC, CMP, lipid panel.  If no issues on labwork, follow up in 1 year for annual.  No orders of the defined types were placed in this encounter.   No orders of the defined types were placed in this encounter.   Patient Instructions  General Preventive Care  Most recent routine screening labs: ordered today.   Blood pressure goal 130/80 or less.   Tobacco: don't!   Alcohol: responsible moderation is ok for most adults - if you have concerns about your alcohol intake, please talk to me!   Exercise: as tolerated to reduce risk of cardiovascular disease and diabetes. Strength training will also prevent osteoporosis.   Mental health: if need for mental health care (medicines, counseling, other), or concerns about moods, please let me know!   Sexual / Reproductive health: if need for STD testing, if concerns with libido/pain problems, if need to discuss family planning, please let me or OBGYN know!   Advanced Directive: Living Will and/or Healthcare Power of Attorney recommended for all adults, regardless of age or health.  Vaccines  Flu vaccine: for almost everyone, every fall.   Shingles vaccine: after age 71.   Pneumonia vaccines: after age 49  Tetanus booster: every 10 years / 3rd trimester of pregnancy  HPV vaccine: Gardasil up to age 79   COVID vaccine: THANKS for getting your vaccine! :)  Cancer screenings   Colon cancer screening: for everyone age 89-75.   Breast cancer screening: mammogram at age 55 every other year at least, and annually after age 60.   Cervical cancer screening: Pap every 1 to 5 years depending on age and other risk  factors. Can usually stop at age 68 or w/ hysterectomy.   Lung cancer screening: not needed for non-smokers  Infection screenings   HIV: recommended screening at least once age 40-65  Gonorrhea/Chlamydia: screening as needed  Hepatitis C: recommended once for everyone age 49-75  TB: certain at-risk populations, or depending on work requirements and/or travel history Other  Bone Density Test: recommended for women at age 56       Visit summary with medication list and pertinent instructions was printed for patient to review. All questions at time of visit were answered - patient instructed to contact office with any additional concerns or updates. ER/RTC precautions were reviewed with the patient.   Please note: voice recognition software was used to produce this document, and typos may escape review. Please contact Dr. Lyn Hollingshead for any needed clarifications.     Follow-up plan: Return in about 1 year (around 11/10/2020) for [place] (call week prior to visit for lab orders).  Rutha Bouchard, [facility] MS3

## 2019-11-12 LAB — CBC
HCT: 40.2 % (ref 35.0–45.0)
Hemoglobin: 13.4 g/dL (ref 11.7–15.5)
MCH: 29.2 pg (ref 27.0–33.0)
MCHC: 33.3 g/dL (ref 32.0–36.0)
MCV: 87.6 fL (ref 80.0–100.0)
MPV: 10.5 fL (ref 7.5–12.5)
Platelets: 232 10*3/uL (ref 140–400)
RBC: 4.59 10*6/uL (ref 3.80–5.10)
RDW: 12.4 % (ref 11.0–15.0)
WBC: 6.2 10*3/uL (ref 3.8–10.8)

## 2019-11-12 LAB — LIPID PANEL
Cholesterol: 216 mg/dL — ABNORMAL HIGH (ref <200)
HDL: 70 mg/dL (ref >=50)
LDL Cholesterol (Calc): 124 mg/dL (calc) — ABNORMAL HIGH
Non-HDL Cholesterol (Calc): 146 mg/dL (calc) — ABNORMAL HIGH (ref <130)
Total CHOL/HDL Ratio: 3.1 (calc) (ref <5.0)
Triglycerides: 117 mg/dL (ref <150)

## 2019-11-12 LAB — COMPLETE METABOLIC PANEL WITH GFR
AG Ratio: 2.2 (calc) (ref 1.0–2.5)
ALT: 11 U/L (ref 6–29)
AST: 13 U/L (ref 10–30)
Albumin: 4.3 g/dL (ref 3.6–5.1)
Alkaline phosphatase (APISO): 55 U/L (ref 31–125)
BUN: 10 mg/dL (ref 7–25)
CO2: 27 mmol/L (ref 20–32)
Calcium: 9.4 mg/dL (ref 8.6–10.2)
Chloride: 103 mmol/L (ref 98–110)
Creat: 0.72 mg/dL (ref 0.50–1.10)
GFR, Est African American: 128 mL/min/{1.73_m2} (ref >=60)
GFR, Est Non African American: 111 mL/min/{1.73_m2} (ref >=60)
Globulin: 2 g/dL (calc) (ref 1.9–3.7)
Glucose, Bld: 87 mg/dL (ref 65–139)
Potassium: 4.2 mmol/L (ref 3.5–5.3)
Sodium: 138 mmol/L (ref 135–146)
Total Bilirubin: 0.5 mg/dL (ref 0.2–1.2)
Total Protein: 6.3 g/dL (ref 6.1–8.1)

## 2019-12-09 ENCOUNTER — Other Ambulatory Visit (HOSPITAL_COMMUNITY): Payer: Self-pay | Admitting: Obstetrics and Gynecology

## 2019-12-09 MED FILL — NORGESTIM-ETH ESTRAD TRIPHA: 0.18/0.215/ | 84 days supply | Qty: 84 | Fill #0

## 2020-02-07 ENCOUNTER — Other Ambulatory Visit (HOSPITAL_COMMUNITY): Payer: Self-pay | Admitting: Obstetrics and Gynecology

## 2020-02-07 DIAGNOSIS — Z6824 Body mass index (BMI) 24.0-24.9, adult: Secondary | ICD-10-CM | POA: Diagnosis not present

## 2020-02-07 DIAGNOSIS — Z01419 Encounter for gynecological examination (general) (routine) without abnormal findings: Secondary | ICD-10-CM | POA: Diagnosis not present

## 2020-02-07 DIAGNOSIS — R87619 Unspecified abnormal cytological findings in specimens from cervix uteri: Secondary | ICD-10-CM | POA: Insufficient documentation

## 2020-02-10 MED FILL — NORGESTIM-ETH ESTRAD TRIPHA: 0.18/0.215/ | 84 days supply | Qty: 84 | Fill #0

## 2020-02-25 ENCOUNTER — Other Ambulatory Visit: Payer: Self-pay | Admitting: Osteopathic Medicine

## 2020-02-25 ENCOUNTER — Other Ambulatory Visit: Payer: Self-pay | Admitting: *Deleted

## 2020-02-25 MED ORDER — ESCITALOPRAM OXALATE 10 MG PO TABS: 10.0000 mg | ORAL_TABLET | Freq: Every day | ORAL | 1 refills | Status: DC

## 2020-02-25 MED FILL — ESCITALOPRAM 10 MG TABLET: 10 | 90 days supply | Qty: 90 | Fill #0

## 2020-02-29 MED FILL — NORGESTIM-ETH ESTRAD TRIPHA: 0.18/0.215/ | 84 days supply | Qty: 84 | Fill #0

## 2020-05-26 ENCOUNTER — Other Ambulatory Visit (HOSPITAL_COMMUNITY): Payer: Self-pay

## 2020-05-26 MED FILL — Norgestimate-Eth Estrad Tab 0.18-35/0.215-35/0.25-35 MG-MCG: ORAL | 84 days supply | Qty: 84 | Fill #0 | Status: AC

## 2020-06-12 ENCOUNTER — Other Ambulatory Visit (HOSPITAL_COMMUNITY): Payer: Self-pay

## 2020-06-12 MED FILL — Escitalopram Oxalate Tab 10 MG (Base Equiv): ORAL | 90 days supply | Qty: 90 | Fill #0 | Status: AC

## 2020-07-21 DIAGNOSIS — F411 Generalized anxiety disorder: Secondary | ICD-10-CM | POA: Diagnosis not present

## 2020-08-04 DIAGNOSIS — F411 Generalized anxiety disorder: Secondary | ICD-10-CM | POA: Diagnosis not present

## 2020-08-12 ENCOUNTER — Other Ambulatory Visit (HOSPITAL_COMMUNITY): Payer: Self-pay

## 2020-08-12 DIAGNOSIS — L0390 Cellulitis, unspecified: Secondary | ICD-10-CM | POA: Diagnosis not present

## 2020-08-12 DIAGNOSIS — N879 Dysplasia of cervix uteri, unspecified: Secondary | ICD-10-CM | POA: Diagnosis not present

## 2020-08-12 DIAGNOSIS — R87610 Atypical squamous cells of undetermined significance on cytologic smear of cervix (ASC-US): Secondary | ICD-10-CM | POA: Diagnosis not present

## 2020-08-12 MED ORDER — CEPHALEXIN 500 MG PO CAPS
ORAL_CAPSULE | ORAL | 0 refills | Status: DC
  Filled 2020-08-12: qty 10, 5d supply, fill #0

## 2020-08-12 MED FILL — Norgestimate-Eth Estrad Tab 0.18-35/0.215-35/0.25-35 MG-MCG: ORAL | 84 days supply | Qty: 84 | Fill #1 | Status: AC

## 2020-08-13 ENCOUNTER — Other Ambulatory Visit (HOSPITAL_COMMUNITY): Payer: Self-pay

## 2020-08-15 DIAGNOSIS — S70362A Insect bite (nonvenomous), left thigh, initial encounter: Secondary | ICD-10-CM | POA: Diagnosis not present

## 2020-08-15 DIAGNOSIS — W57XXXA Bitten or stung by nonvenomous insect and other nonvenomous arthropods, initial encounter: Secondary | ICD-10-CM | POA: Diagnosis not present

## 2020-08-17 ENCOUNTER — Other Ambulatory Visit: Payer: Self-pay

## 2020-08-18 ENCOUNTER — Ambulatory Visit: Payer: BC Managed Care – PPO | Admitting: Osteopathic Medicine

## 2020-08-18 DIAGNOSIS — F411 Generalized anxiety disorder: Secondary | ICD-10-CM | POA: Diagnosis not present

## 2020-08-23 ENCOUNTER — Encounter: Payer: Self-pay | Admitting: Physician Assistant

## 2020-08-23 ENCOUNTER — Telehealth: Payer: BC Managed Care – PPO | Admitting: Physician Assistant

## 2020-08-23 DIAGNOSIS — M545 Low back pain, unspecified: Secondary | ICD-10-CM | POA: Diagnosis not present

## 2020-08-23 DIAGNOSIS — M79604 Pain in right leg: Secondary | ICD-10-CM

## 2020-08-23 MED ORDER — NAPROXEN 500 MG PO TABS: 500.0000 mg | ORAL_TABLET | Freq: Two times a day (BID) | ORAL | 0 refills | Status: DC

## 2020-08-23 MED ORDER — TIZANIDINE HCL 2 MG PO TABS: 2.0000 mg | ORAL_TABLET | Freq: Three times a day (TID) | ORAL | 0 refills | Status: DC | PRN

## 2020-08-23 NOTE — Progress Notes (Addendum)

## 2020-08-31 ENCOUNTER — Ambulatory Visit: Payer: BC Managed Care – PPO | Admitting: Family Medicine

## 2020-09-01 DIAGNOSIS — F411 Generalized anxiety disorder: Secondary | ICD-10-CM | POA: Diagnosis not present

## 2020-09-02 ENCOUNTER — Other Ambulatory Visit: Payer: Self-pay

## 2020-09-02 ENCOUNTER — Ambulatory Visit: Payer: PRIVATE HEALTH INSURANCE | Attending: Occupational Medicine

## 2020-09-02 DIAGNOSIS — M545 Low back pain, unspecified: Secondary | ICD-10-CM | POA: Diagnosis present

## 2020-09-02 DIAGNOSIS — R252 Cramp and spasm: Secondary | ICD-10-CM | POA: Diagnosis not present

## 2020-09-02 NOTE — Therapy (Signed)
[facility] [address], [address] [place], [place], [address] Phone: [phone]   Fax:  [phone]  Physical Therapy Evaluation  Patient Details  Name: Monique Ellison MRN: [id number] Date of Birth: June 20, 1985 Referring Provider (PT): Kathrine Haddock, MD   Encounter Date: 09/02/2020   PT End of Session - 09/02/20 1606     Visit Number 1    Date for PT Re-Evaluation 10/28/20    Authorization Type W/C: 9 visits approved    Authorization - Visit Number 1    Authorization - Number of Visits 9    PT Start Time 1532    PT Stop Time 1608    PT Time Calculation (min) 36 min    Activity Tolerance Patient tolerated treatment well    Behavior During Therapy [facility] for tasks assessed/performed             Past Medical History:  Diagnosis Date   Family history of heart disease 05/29/2015   Father had MI in 9's   Former smoker 05/29/2015   Quit 2008   GERD (gastroesophageal reflux disease) 05/29/2015   Seasonal allergies     Past Surgical History:  Procedure Laterality Date   WISDOM TOOTH EXTRACTION      There were no vitals filed for this visit.    Subjective Assessment - 09/02/20 1537     Subjective Pt reports to PT with Rt sided LBP that began 08/21/20 at work at [employer].  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  Pt felt immediate pain. Pt is now on light duty and has not returned to work since injury. Pt had dry needling a few days after injury at another facility and this helped to reduce her pain.    Pertinent History none    Limitations Sitting;Standing;Walking    How long can you sit comfortably? 1-1.5 hours    How long can you stand comfortably? 1 hour    How long can you walk comfortably? 1 hour- pain after this    Diagnostic tests none    Patient Stated Goals reduce LBP    Currently in Pain? Yes    Pain Score 2    up to 7/10 with walking   Pain Location Back    Pain  Orientation Right;Lower    Pain Descriptors / Indicators Aching;Tightness    Pain Type Acute pain    Pain Onset More than a month ago    Aggravating Factors  walking, hills    Pain Relieving Factors ice, heat, rest                [facility] PT Assessment - 09/02/20 0001       Assessment   Medical Diagnosis low back strain    Referring Provider (PT) Kathrine Haddock, MD    Onset Date/Surgical Date 08/21/20    Next MD Visit 09/10/20    Prior Therapy dry needling for this problem-1 session      Precautions   Precautions None      Restrictions   Weight Bearing Restrictions No      Balance Screen   Has the patient fallen in the past 6 months No    Has the patient had a decrease in activity level because of a fear of falling?  No    Is the patient reluctant to leave their home because of a fear of falling?  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    Leisure walk dog, Medical sales representative   Overall Cognitive Status Within Functional Limits for tasks assessed      Observation/Other Assessments   Focus on Therapeutic Outcomes (FOTO)  48 (goal is 67)      Posture/Postural Control   Posture/Postural Control Postural limitations    Postural Limitations Weight shift left      ROM / Strength   AROM / PROM / Strength AROM;Strength;PROM      AROM   Overall AROM  Within functional limits for tasks performed    Overall AROM Comments Rt lumbar/SI Joint pain with sidebending to the Rt      PROM   Overall PROM  Within functional limits for tasks performed    Overall PROM Comments Rt SI joint pain with end range hip flexion and IR      Strength   Overall Strength Within functional limits for tasks performed      Palpation    Spinal mobility normal without pain in the lumbar spine    SI assessment  decreased Rt SI joint mobility with pain    Palpation comment localized palpable tenderness over Rt SI joint and proximal gluteals      Special Tests    Special Tests Sacrolliac Tests    Sacroiliac Tests  Pelvic Compression      Pelvic Compression   Findings Positive    Side Right      Transfers   Transfers Independent with all Transfers      Ambulation/Gait   Ambulation/Gait Yes    Ambulation/Gait Assistance 7: Independent    Gait Pattern Within Functional Limits                        Objective measurements completed on examination: See above findings.               PT Education - 09/02/20 1601     Education Details Access Code: [email or web address]    Person(s) Educated Patient    Methods Explanation;Demonstration;Handout    Comprehension Verbalized understanding;Returned demonstration              PT Short Term Goals - 09/02/20 1548       PT SHORT TERM GOAL #1   Title be independent in initial HEP    Time 4    Period Weeks    Status New    Target Date 09/30/20      PT SHORT TERM GOAL #2   Title report < or = to 4/10 LBP after walking her dog    Time 4    Period Weeks    Status New    Target Date 09/30/20               PT Long Term Goals - 09/02/20 1609       PT LONG TERM GOAL #1   Title be independent in advanced HEP    Time 8    Period Weeks    Status New    Target Date 10/28/20      PT LONG TERM GOAL #2   Title improve FOTO to > or = to 67  to improve function    Time 8    Period Weeks    Status New    Target Date 10/28/20      PT LONG TERM GOAL #3   Title stand with Rt=Lt weightbearing x 1 hour without limitation for home and work tasks    Time 8    Period Weeks    Status New    Target Date 10/28/20      PT LONG TERM GOAL #4   Title report < or = to 3/10 LBP with walking her dog    Time 8    Period Weeks    Status New    Target Date  10/28/20      PT LONG TERM GOAL #5   Title verbalize and demonstrate body mechanics modifications for lumbar safety to allow for lifting and return to work    Time 8    Period Weeks    Status New    Target Date 10/28/20      Additional Long Term Goals   Additional Long Term Goals Yes      PT LONG TERM GOAL #6   Title reduce LBP and improve lumbopelvic strength 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  Pt had immediate pain and did an e-visit the next day.  She tried to return to her nursing duties but had too much pain.  She is now on light duty.  Pt reports 2/10 Rt sided SI joint/low back pain at rest and up to 7/10 with walking and negotiating inclines.  Pain is reduced with rest, heat and ice.  Pt is limited to standing and walking 45 min to 1 hour and sitting 1-1.5 hours.  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  Pt with Lt>Rt weightbearing in standing and is able to correct with verbal and tactile cues in neutral.  Pt with localized palpable tenderness of Rt SI joint, PSIS and proximal gluteals.  Pt with positive SI joint compression test on the Rt.  Pt with full A/ROM and P/ROM of hips and lumbar spine. Pt will benefit from skilled PT to address Rt lumbar pain, flexibility and lumbopelvic strength and mechanics to allow for return to work.    Examination-Activity Limitations Bend;Caring for Others;Lift;Stand;Squat;Sit    Examination-Participation Restrictions Community Activity;Meal Prep    Stability/Clinical Decision Making Stable/Uncomplicated    Clinical Decision Making Low    Rehab Potential  Excellent    PT Frequency 2x / week    PT Duration 8 weeks    PT Treatment/Interventions ADLs/Self Care Home Management;Cryotherapy;Electrical Stimulation;Moist Heat;Traction;Gait training;Stair training;Functional mobility training;Therapeutic activities;Therapeutic exercise;Balance training;Neuromuscular re-education;Manual techniques;Patient/family education;Passive range of motion;Dry needling;Joint Manipulations;Spinal Manipulations;Taping    PT Next Visit Plan dry needling to Rt gluteals and lumbar spine, lumbopelvic strength, review HEP, body mechanics education    PT Home Exercise Plan Access Code: [email or web address]    Consulted and Agree with Plan of Care  Patient             Patient will benefit from skilled therapeutic intervention in order to improve the following deficits and impairments:  Abnormal gait, Decreased activity tolerance, Decreased mobility, Decreased strength, Postural dysfunction, Improper body mechanics, Impaired flexibility, Pain, Increased muscle spasms, Decreased endurance, Difficulty walking  Visit Diagnosis: Cramp and spasm - Plan: PT plan of care cert/re-cert  Acute bilateral low back pain without sciatica - Plan: PT plan of care cert/re-cert     Problem List Patient Active Problem List   Diagnosis Date Noted   Family history of heart disease 08/16/2017   Anxiety 02/23/2017   Tension headache 08/22/2016   GERD (gastroesophageal reflux disease) 05/29/2015   Former smoker 05/29/2015    Lorrene ReidKelly Teena Mangus, PT 09/02/20 5:09 PM   [facility] [address], [address] [place], [place], [address] Phone: [phone]   Fax:  [phone]  Name: Monique Ellison MRN: [id number] Date of Birth: 1985/04/05

## 2020-09-02 NOTE — Patient Instructions (Signed)
Access Code: [email or web address] URL: [email or web address] Date: 09/02/2020 Prepared by: Tresa Endo  Exercises Supine Piriformis Stretch - 3 x daily - 7 x weekly - 1 sets - 3 reps - 20 hold Seated Figure 4 Piriformis Stretch - 3 x daily - 7 x weekly - 1 sets - 3 reps - 20 hold Supine Lower Trunk Rotation - 3 x daily - 7 x weekly - 3 sets - 10 reps Seated Hamstring Stretch - 3 x daily - 7 x weekly - 1 sets - 3 reps - 20 hold Supine Transversus Abdominis Bracing - Hands on Stomach - 1 x daily - 7 x weekly - 3 sets - 10 reps - 5 hold Seated Transversus Abdominis Bracing - 1 x daily - 7 x weekly - 3 sets - 10 reps - 5 hold

## 2020-09-05 DIAGNOSIS — M545 Low back pain, unspecified: Secondary | ICD-10-CM | POA: Diagnosis present

## 2020-09-05 DIAGNOSIS — R252 Cramp and spasm: Secondary | ICD-10-CM | POA: Diagnosis present

## 2020-09-07 ENCOUNTER — Ambulatory Visit: Payer: PRIVATE HEALTH INSURANCE

## 2020-09-07 ENCOUNTER — Other Ambulatory Visit: Payer: Self-pay

## 2020-09-07 DIAGNOSIS — R252 Cramp and spasm: Secondary | ICD-10-CM

## 2020-09-07 DIAGNOSIS — M545 Low back pain, unspecified: Secondary | ICD-10-CM

## 2020-09-07 NOTE — Therapy (Signed)
[facility] [address] [place], [place], [address] Phone: [phone]   Fax:  [phone]  Physical Therapy Treatment  Patient Details  Name: Monique Ellison MRN: [id number] Date of Birth: May 17, 1985 Referring Provider (PT): Kathrine Haddock, MD   Encounter Date: 09/07/2020   PT End of Session - 09/07/20 0929     Visit Number 2    Date for PT Re-Evaluation 10/28/20    Authorization Type W/C: 9 visits approved    Authorization - Visit Number 2    Authorization - Number of Visits 9    PT Start Time 0846    PT Stop Time 0929    PT Time Calculation (min) 43 min    Activity Tolerance Patient tolerated treatment well    Behavior During Therapy [facility] for tasks assessed/performed             Past Medical History:  Diagnosis Date   Family history of heart disease 05/29/2015   Father had MI in 39's   Former smoker 05/29/2015   Quit 2008   GERD (gastroesophageal reflux disease) 05/29/2015   Seasonal allergies     Past Surgical History:  Procedure Laterality Date   WISDOM TOOTH EXTRACTION      There were no vitals filed for this visit.   Subjective Assessment - 09/07/20 0846     Subjective I think things are a little bit better.  Doing my stretches.    Patient Stated Goals reduce LBP    Currently in Pain? No/denies    Pain Score --   aggravating- up to 2-3/10   Pain Orientation Right;Lower    Pain Descriptors / Indicators Aching;Tightness    Pain Onset More than a month ago    Aggravating Factors  walking, hills    Pain Relieving Factors ice, heat, rest                               [facility] Adult PT Treatment/Exercise - 09/07/20 0001       Exercises   Exercises Knee/Hip;Lumbar      Lumbar Exercises: Stretches   Active Hamstring Stretch Left;Right;4 reps;20 seconds    Lower Trunk Rotation 2 reps;20 seconds    Piriformis Stretch 2 reps;20 seconds      Knee/Hip Exercises: Sidelying    Clams 2x10 bil each- tactile and verbal cues      Manual Therapy   Manual Therapy Soft tissue mobilization;Myofascial release    Manual therapy comments skilled palpation with dry needling and assessment of tissue and response    Soft tissue mobilization elongation and release to bil lumbar paraspinals and gluteals after needling              Trigger Point Dry Needling - 09/07/20 0001     Consent Given? Yes    Education Handout Provided Yes    Muscles Treated Back/Hip Gluteus minimus;Gluteus medius;Piriformis;Lumbar multifidi    Gluteus Minimus Response Twitch response elicited;Palpable increased muscle length    Gluteus Medius Response Twitch response elicited;Palpable increased muscle length    Piriformis Response Twitch response elicited;Palpable increased muscle length    Lumbar multifidi Response Twitch response elicited;Palpable increased muscle length                  PT Education - 09/07/20 0851     Education Details DN, body mechanics education    Person(s) Educated Patient    Methods Explanation;Handout  Comprehension Verbalized understanding;Returned demonstration              PT Short Term Goals - 09/07/20 0916       PT SHORT TERM GOAL #1   Title be independent in initial HEP    Status On-going      PT SHORT TERM GOAL #2   Title report < or = to 4/10 LBP after walking her dog    Status On-going               PT Long Term Goals - 09/02/20 1609       PT LONG TERM GOAL #1   Title be independent in advanced HEP    Time 8    Period Weeks    Status New    Target Date 10/28/20      PT LONG TERM GOAL #2   Title improve FOTO to > or = to 67 to improve function    Time 8    Period Weeks    Status New    Target Date 10/28/20      PT LONG TERM GOAL #3   Title stand with Rt=Lt weightbearing x 1 hour without limitation for home and work tasks    Time 8    Period Weeks    Status New    Target Date 10/28/20      PT LONG TERM  GOAL #4   Title report < or = to 3/10 LBP with walking her dog    Time 8    Period Weeks    Status New    Target Date 10/28/20      PT LONG TERM GOAL #5   Title verbalize and demonstrate body mechanics modifications for lumbar safety to allow for lifting and return to work    Time 8    Period Weeks    Status New    Target Date 10/28/20      Additional Long Term Goals   Additional Long Term Goals Yes      PT LONG TERM GOAL #6   Title reduce LBP and improve lumbopelvic strength 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  Pt has been independent and compliant with HEP for flexibility and TA activation.  Pt with tension and trigger points in bil lumbar spine and gluteals. Pt with good twitch response and response to dry needing and manual therapy today with improved tissue mobility.  Pt requires tactile cues for TA activation.  Pt received body mechanics education today to reduce lumbar strain at home and work.  Pt will continue to benefit from skilled PT to address lumbopelvic strength, flexibility and manual therapy to address tissue mobility and pain.    PT Frequency 2x / week    PT Duration 8 weeks    PT Treatment/Interventions ADLs/Self Care Home Management;Cryotherapy;Electrical Stimulation;Moist Heat;Traction;Gait training;Stair training;Functional mobility training;Therapeutic activities;Therapeutic exercise;Balance training;Neuromuscular re-education;Manual techniques;Patient/family education;Passive range of motion;Dry needling;Joint Manipulations;Spinal Manipulations;Taping    PT Next Visit Plan assess response to dry needling, manual therapy, add clams to HEP    PT Home Exercise Plan Access Code: [email or web address]    Consulted  and Agree with Plan of Care  Patient             Patient will benefit from skilled therapeutic intervention in order to improve the following deficits and impairments:  Abnormal gait, Decreased activity tolerance, Decreased mobility, Decreased strength, Postural dysfunction, Improper body mechanics, Impaired flexibility, Pain, Increased muscle spasms, Decreased endurance, Difficulty walking  Visit Diagnosis: Cramp and spasm  Acute bilateral low back pain without sciatica     Problem List Patient Active Problem List   Diagnosis Date Noted   Family history of heart disease 08/16/2017   Anxiety 02/23/2017   Tension headache 08/22/2016   GERD (gastroesophageal reflux disease) 05/29/2015   Former smoker 05/29/2015    Lorrene Reid, PT 09/07/20 9:31 AM   [facility] [address] [place], [place], [address] Phone: [phone]   Fax:  [phone]  Name: ZOHRA CLAVEL MRN: [id number] Date of Birth: [birth year]

## 2020-09-07 NOTE — Patient Instructions (Signed)
Trigger Point Dry Needling  What is Trigger Point Dry Needling (DN)? DN is a physical therapy technique used to treat muscle pain and dysfunction. Specifically, DN helps deactivate muscle trigger points (muscle knots).  A thin filiform needle is used to penetrate the skin and stimulate the underlying trigger point. The goal is for a local twitch response (LTR) to occur and for the trigger point to relax. No medication of any kind is injected during the procedure.   What Does Trigger Point Dry Needling Feel Like?  The procedure feels different for each individual patient. Some patients report that they do not actually feel the needle enter the skin and overall the process is not painful. Very mild bleeding may occur. However, many patients feel a deep cramping in the muscle in which the needle was inserted. This is the local twitch response.   How Will I feel after the treatment? Soreness is normal, and the onset of soreness may not occur for a few hours. Typically this soreness does not last longer than two days.  Bruising is uncommon, however; ice can be used to decrease any possible bruising.  In rare cases feeling tired or nauseous after the treatment is normal. In addition, your symptoms may get worse before they get better, this period will typically not last longer than 24 hours.   What Can I do After My Treatment? Increase your hydration by drinking more water for the next 24 hours. You may place ice or heat on the areas treated that have become sore, however, do not use heat on inflamed or bruised areas. Heat often brings more relief post needling. You can continue your regular activities, but vigorous activity is not recommended initially after the treatment for 24 hours. DN is best combined with other physical therapy such as strengthening, stretching, and other therapies.      Lifting Principles  Maintain proper posture and head alignment. Slide object as close as possible before  lifting. Move obstacles out of the way. Test before lifting; ask for help if too heavy. Tighten stomach muscles without holding breath. Use smooth movements; do not jerk. Use legs to do the work, and pivot with feet. Distribute the work load symmetrically and close to the center of trunk. Push instead of pull whenever possible.   Squat down and hold basket close to stand. Use leg muscles to do the work.    Avoid twisting or bending back. Pivot around using foot movements, and bend at knees if needed when reaching for articles.        Getting Into / Out of Bed   Lower self to lie down on one side by raising legs and lowering head at the same time. Use arms to assist moving without twisting. Bend both knees to roll onto back if desired. To sit up, start from lying on side, and use same move-ments in reverse. Keep trunk aligned with legs.    Shift weight from front foot to back foot as item is lifted off shelf.    When leaning forward to pick object up from floor, extend one leg out behind. Keep back straight. Hold onto a sturdy support with other hand.      Sit upright, head facing forward. Try using a roll to support lower back. Keep shoulders relaxed, and avoid rounded back. Keep hips level with knees. Avoid crossing legs for long periods.      [facility] [address], [address] [place], [place] [address] Phone # [phone]  Fax [phone]

## 2020-09-09 ENCOUNTER — Other Ambulatory Visit: Payer: Self-pay

## 2020-09-09 ENCOUNTER — Ambulatory Visit: Payer: PRIVATE HEALTH INSURANCE | Attending: Occupational Medicine

## 2020-09-09 DIAGNOSIS — M545 Low back pain, unspecified: Secondary | ICD-10-CM | POA: Diagnosis not present

## 2020-09-09 DIAGNOSIS — R252 Cramp and spasm: Secondary | ICD-10-CM | POA: Diagnosis not present

## 2020-09-09 NOTE — Therapy (Signed)
[facility] [address], [address] [place], [place], [address] Phone: [phone]   Fax:  [phone]  Physical Therapy Treatment  Patient Details  Name: Monique Ellison MRN: [id number] Date of Birth: 1985-08-19 Referring Provider (PT): Kathrine Haddock, MD   Encounter Date: 09/09/2020   PT End of Session - 09/09/20 1634     Visit Number 3    Date for PT Re-Evaluation 10/28/20    Authorization Type W/C: 9 visits approved    Authorization - Visit Number 3    Authorization - Number of Visits 9    PT Start Time 1604    PT Stop Time 1634    PT Time Calculation (min) 30 min    Activity Tolerance Patient tolerated treatment well    Behavior During Therapy [facility] for tasks assessed/performed             Past Medical History:  Diagnosis Date   Family history of heart disease 05/29/2015   Father had MI in 34's   Former smoker 05/29/2015   Quit 2008   GERD (gastroesophageal reflux disease) 05/29/2015   Seasonal allergies     Past Surgical History:  Procedure Laterality Date   WISDOM TOOTH EXTRACTION      There were no vitals filed for this visit.   Subjective Assessment - 09/09/20 1605     Subjective I feel a lot better.  I feel 95% better since the start of care.    Currently in Pain? No/denies                               [facility] Adult PT Treatment/Exercise - 09/09/20 0001       Lumbar Exercises: Stretches   Active Hamstring Stretch Left;Right;4 reps;20 seconds    Piriformis Stretch 2 reps;20 seconds      Lumbar Exercises: Standing   Other Standing Lumbar Exercises pallof press green band 2x10    Other Standing Lumbar Exercises pallof press with squat 2x10 with green band      Lumbar Exercises: Supine   Ab Set 20 reps    AB Set Limitations ball squeeze      Knee/Hip Exercises: Sidelying   Clams 2x10 bil each- tactile and verbal cues                    PT Education - 09/09/20  1625     Education Details Access Code: [email or web address]    Person(s) Educated Patient    Methods Explanation;Demonstration;Handout    Comprehension Verbalized understanding;Returned demonstration              PT Short Term Goals - 09/09/20 1606       PT SHORT TERM GOAL #1   Title be independent in initial HEP    Status Achieved      PT SHORT TERM GOAL #2   Title report < or = to 4/10 LBP after walking her dog    Status On-going               PT Long Term Goals - 09/02/20 1609       PT LONG TERM GOAL #1   Title be independent in advanced HEP    Time 8    Period Weeks    Status New    Target Date 10/28/20      PT LONG TERM GOAL #2   Title improve FOTO to > or =  to 67 to improve function    Time 8    Period Weeks    Status New    Target Date 10/28/20      PT LONG TERM GOAL #3   Title stand with Rt=Lt weightbearing x 1 hour without limitation for home and work tasks    Time 8    Period Weeks    Status New    Target Date 10/28/20      PT LONG TERM GOAL #4   Title report < or = to 3/10 LBP with walking her dog    Time 8    Period Weeks    Status New    Target Date 10/28/20      PT LONG TERM GOAL #5   Title verbalize and demonstrate body mechanics modifications for lumbar safety to allow for lifting and return to work    Time 8    Period Weeks    Status New    Target Date 10/28/20      Additional Long Term Goals   Additional Long Term Goals Yes      PT LONG TERM GOAL #6   Title reduce LBP and improve lumbopelvic strength 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  Pt continues to stretch and is making body mechanics modifications at home for lumbar protections.  PT added to HEP to include lumbopelvic  strength.  Pt demonstrates fatigue and weakness with sidelying clams and supine ball squeezes.  Pt required tactile cues for alignment and gluteal activation.  Pt will work on these exercises and return next week for manual therapy and review of HEP.    Rehab Potential Excellent    PT Frequency 2x / week    PT Duration 8 weeks    PT Treatment/Interventions ADLs/Self Care Home Management;Cryotherapy;Electrical Stimulation;Moist Heat;Traction;Gait training;Stair training;Functional mobility training;Therapeutic activities;Therapeutic exercise;Balance training;Neuromuscular re-education;Manual techniques;Patient/family education;Passive range of motion;Dry needling;Joint Manipulations;Spinal Manipulations;Taping    PT Next Visit Plan review HEP, manual therapy and dry needling    PT Home Exercise Plan Access Code: [email or web address]    Consulted and Agree with Plan of Care Patient             Patient will benefit from skilled therapeutic intervention in order to improve the following deficits and impairments:  Abnormal gait, Decreased activity tolerance, Decreased mobility, Decreased strength, Postural dysfunction, Improper body mechanics, Impaired flexibility, Pain, Increased muscle spasms, Decreased endurance, Difficulty walking  Visit Diagnosis: Cramp and spasm  Acute bilateral low back pain without sciatica     Problem List Patient Active Problem List   Diagnosis Date Noted   Family history of heart disease 08/16/2017   Anxiety 02/23/2017   Tension headache 08/22/2016   GERD (gastroesophageal reflux disease) 05/29/2015   Former smoker 05/29/2015    Lorrene Reid, PT 09/09/20 4:39 PM   [facility] [address], [address] [place], [place], [address] Phone: [phone]   Fax:  [phone]  Name: Monique Ellison MRN: [id number] Date of Birth: [birth year]

## 2020-09-09 NOTE — Patient Instructions (Signed)
Access Code: [email or web address] URL: [email or web address] Date: 09/09/2020 Prepared by: Tresa Endo  Exercises  Clamshell - 2 x daily - 7 x weekly - 2 sets - 10 reps Supine Hip Adduction Isometric with Ball - 1 x daily - 7 x weekly - 3 sets - 10 reps Standing Anti-Rotation Press with Anchored Resistance - 2 x daily - 7 x weekly - 2 sets - 10 reps Squatting Anti-Rotation Press - 1 x daily - 7 x weekly - 3 sets - 10 reps

## 2020-09-14 ENCOUNTER — Ambulatory Visit: Payer: PRIVATE HEALTH INSURANCE

## 2020-09-14 ENCOUNTER — Other Ambulatory Visit: Payer: Self-pay

## 2020-09-14 DIAGNOSIS — R252 Cramp and spasm: Secondary | ICD-10-CM

## 2020-09-14 DIAGNOSIS — M545 Low back pain, unspecified: Secondary | ICD-10-CM

## 2020-09-14 NOTE — Therapy (Signed)
[facility] [address] [place], [place], [address] Phone: [phone]   Fax:  [phone]  Physical Therapy Treatment  Patient Details  Name: Monique Ellison MRN: [id number] Date of Birth: September 07, 1985 Referring Provider (PT): Kathrine Haddock, MD   Encounter Date: 09/14/2020   PT End of Session - 09/14/20 1609     Visit Number 4    Date for PT Re-Evaluation 10/28/20    Authorization Type W/C: 9 visits approved    Authorization - Visit Number 4    Authorization - Number of Visits 9    PT Start Time 1534    PT Stop Time 1608    PT Time Calculation (min) 34 min    Activity Tolerance Patient tolerated treatment well    Behavior During Therapy [facility] for tasks assessed/performed             Past Medical History:  Diagnosis Date   Family history of heart disease 05/29/2015   Father had MI in 10's   Former smoker 05/29/2015   Quit 2008   GERD (gastroesophageal reflux disease) 05/29/2015   Seasonal allergies     Past Surgical History:  Procedure Laterality Date   WISDOM TOOTH EXTRACTION      There were no vitals filed for this visit.   Subjective Assessment - 09/14/20 1537     Subjective I feel like that spot of pain is in my back.  It isn't terrible.  I can feel it when I walk the dog.    Patient Stated Goals reduce LBP    Currently in Pain? No/denies                               [facility] Adult PT Treatment/Exercise - 09/14/20 0001       Lumbar Exercises: Standing   Other Standing Lumbar Exercises pallof press green band 2x10    Other Standing Lumbar Exercises pallof press with squat 2x10 with green band      Knee/Hip Exercises: Sidelying   Clams 2x10 bil each- tactile and verbal cues      Manual Therapy   Manual Therapy Soft tissue mobilization;Myofascial release    Manual therapy comments skilled palpation with dry needling and assessment of tissue and response    Soft tissue  mobilization elongation and release to bil lumbar paraspinals and gluteals after needling              Trigger Point Dry Needling - 09/14/20 0001     Consent Given? Yes    Muscles Treated Back/Hip Gluteus minimus;Gluteus medius;Piriformis;Lumbar multifidi    Gluteus Minimus Response Twitch response elicited;Palpable increased muscle length    Gluteus Medius Response Twitch response elicited;Palpable increased muscle length    Piriformis Response Twitch response elicited;Palpable increased muscle length    Lumbar multifidi Response Twitch response elicited;Palpable increased muscle length                    PT Short Term Goals - 09/09/20 1606       PT SHORT TERM GOAL #1   Title be independent in initial HEP    Status Achieved      PT SHORT TERM GOAL #2   Title report < or = to 4/10 LBP after walking her dog    Status On-going               PT Long Term Goals -  09/02/20 1609       PT LONG TERM GOAL #1   Title be independent in advanced HEP    Time 8    Period Weeks    Status New    Target Date 10/28/20      PT LONG TERM GOAL #2   Title improve FOTO to > or = to 67 to improve function    Time 8    Period Weeks    Status New    Target Date 10/28/20      PT LONG TERM GOAL #3   Title stand with Rt=Lt weightbearing x 1 hour without limitation for home and work tasks    Time 8    Period Weeks    Status New    Target Date 10/28/20      PT LONG TERM GOAL #4   Title report < or = to 3/10 LBP with walking her dog    Time 8    Period Weeks    Status New    Target Date 10/28/20      PT LONG TERM GOAL #5   Title verbalize and demonstrate body mechanics modifications for lumbar safety to allow for lifting and return to work    Time 8    Period Weeks    Status New    Target Date 10/28/20      Additional Long Term Goals   Additional Long Term Goals Yes      PT LONG TERM GOAL #6   Title reduce LBP and improve 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  Pt continues to stretch and is making body mechanics modifications at home for lumbar protection.  Pt reports that she still feels the area on the Rt side of her back and feels like she needs to be careful with regular activity but not limited.   Pt demonstrated all aspects of HEP correctly today.  Pt with tension in Rt lumbar spine and gluteals and demonstrated good twitch response to DN and improved tissue mobility after dry needling today.  Pt will work on these exercises and return next week for manual therapy and review of HEP.    PT Frequency 2x / week    PT Duration 8 weeks    PT Treatment/Interventions ADLs/Self Care Home Management;Cryotherapy;Electrical Stimulation;Moist Heat;Traction;Gait training;Stair training;Functional mobility training;Therapeutic activities;Therapeutic exercise;Balance training;Neuromuscular re-education;Manual techniques;Patient/family education;Passive range of motion;Dry needling;Joint Manipulations;Spinal Manipulations;Taping    PT Next Visit Plan assess response to DN, review HEP, continue lumbopelvic strength    PT Home Exercise Plan Access Code: [email or web address]    Consulted and Agree with Plan of Care Patient             Patient will benefit from skilled therapeutic intervention in order to improve the following deficits and impairments:  Abnormal gait, Decreased activity tolerance, Decreased mobility, Decreased strength, Postural dysfunction, Improper body mechanics, Impaired flexibility, Pain, Increased muscle spasms, Decreased endurance, Difficulty walking  Visit Diagnosis: Cramp and spasm  Acute bilateral low back pain without sciatica     Problem List Patient Active  Problem List  Diagnosis Date Noted   Family history of heart disease 08/16/2017   Anxiety 02/23/2017   Tension headache 08/22/2016   GERD (gastroesophageal reflux disease) 05/29/2015   Former smoker 05/29/2015     Lorrene Reid, PT 09/14/20 4:12 PM   [place] [facility] [address] [place], [place], [address] Phone: [phone]   Fax:  [phone]  Name: Monique Ellison MRN: [id number] Date of Birth: [birth year]

## 2020-09-15 DIAGNOSIS — F411 Generalized anxiety disorder: Secondary | ICD-10-CM | POA: Diagnosis not present

## 2020-09-16 ENCOUNTER — Ambulatory Visit: Payer: PRIVATE HEALTH INSURANCE

## 2020-09-16 ENCOUNTER — Other Ambulatory Visit: Payer: Self-pay

## 2020-09-16 DIAGNOSIS — M545 Low back pain, unspecified: Secondary | ICD-10-CM

## 2020-09-16 DIAGNOSIS — R252 Cramp and spasm: Secondary | ICD-10-CM

## 2020-09-16 NOTE — Therapy (Signed)
[facility] [address], [address] [place], [place], [address] Phone: [phone]   Fax:  [phone]  Physical Therapy Treatment  Patient Details  Name: Monique Ellison MRN: [id number] Date of Birth: Apr 14, 1985 Referring Provider (PT): Kathrine Haddock, MD   Encounter Date: 09/16/2020   PT End of Session - 09/16/20 1637     Visit Number 5    Date for PT Re-Evaluation 10/28/20    Authorization Type W/C: 9 visits approved    Authorization - Visit Number 5    Authorization - Number of Visits 9    PT Start Time 1618    PT Stop Time 1649    PT Time Calculation (min) 31 min    Activity Tolerance Patient tolerated treatment well    Behavior During Therapy [facility] for tasks assessed/performed             Past Medical History:  Diagnosis Date   Family history of heart disease 05/29/2015   Father had MI in 39's   Former smoker 05/29/2015   Quit 2008   GERD (gastroesophageal reflux disease) 05/29/2015   Seasonal allergies     Past Surgical History:  Procedure Laterality Date   WISDOM TOOTH EXTRACTION      There were no vitals filed for this visit.   Subjective Assessment - 09/16/20 1622     Subjective Soreness after last session.  No increase in pain.    Currently in Pain? No/denies                               [facility] Adult PT Treatment/Exercise - 09/16/20 0001       Lumbar Exercises: Stretches   Active Hamstring Stretch Left;Right;4 reps;20 seconds      Lumbar Exercises: Standing   Row Strengthening;Both;20 reps;Theraband    Theraband Level (Row) Level 3 (Green)    Shoulder Extension Strengthening;20 reps;Theraband    Theraband Level (Shoulder Extension) Level 3 (Green)      Lumbar Exercises: Seated   Sit to Stand 20 reps    Sit to Stand Limitations holding 10# kettlebell      Lumbar Exercises: Supine   Ab Set 20 reps    AB Set Limitations ball squeeze    Bridge 20 reps;5 seconds       Lumbar Exercises: Sidelying   Other Sidelying Lumbar Exercises open book stretch x10      Lumbar Exercises: Prone   Straight Leg Raise 20 reps                      PT Short Term Goals - 09/16/20 1624       PT SHORT TERM GOAL #2   Title report < or = to 4/10 LBP after walking her dog    Status Achieved               PT Long Term Goals - 09/16/20 1630       PT LONG TERM GOAL #1   Title be independent in advanced HEP    Status On-going      PT LONG TERM GOAL #3   Title stand with Rt=Lt weightbearing x 1 hour without limitation for home and work tasks    Status Achieved                   Plan - 09/16/20 1628     Clinical Impression Statement Pt  denies any pain today.  Pt is standing with Rt=Lt weightbearing for housework now.  Session focused on functional strength training required for work endurance and lumbar protection with work tasks.  Pt required minor verbal and tactile cues for technique and core/gluteal engagement.  Pt will continue to benefit from skilled PT to improve tissue mobility and lumbopelvic strength required for safe return to work.    PT Treatment/Interventions ADLs/Self Care Home Management;Cryotherapy;Electrical Stimulation;Moist Heat;Traction;Gait training;Stair training;Functional mobility training;Therapeutic activities;Therapeutic exercise;Balance training;Neuromuscular re-education;Manual techniques;Patient/family education;Passive range of motion;Dry needling;Joint Manipulations;Spinal Manipulations;Taping    PT Next Visit Plan DN next session as needed.  See what MD says about return to work    PT Home Exercise Plan Access Code: [email or web address]    Consulted and Agree with Plan of Care Patient             Patient will benefit from skilled therapeutic intervention in order to improve the following deficits and impairments:  Abnormal gait, Decreased activity tolerance, Decreased mobility, Decreased strength, Postural dysfunction,  Improper body mechanics, Impaired flexibility, Pain, Increased muscle spasms, Decreased endurance, Difficulty walking  Visit Diagnosis: Acute bilateral low back pain without sciatica  Cramp and spasm     Problem List Patient Active Problem List   Diagnosis Date Noted   Family history of heart disease 08/16/2017   Anxiety 02/23/2017   Tension headache 08/22/2016   GERD (gastroesophageal reflux disease) 05/29/2015   Former smoker 05/29/2015    Lorrene Reid, PT 09/16/20 4:46 PM  [facility] [facility] [address], [address] [place], [place], [address] Phone: [phone]   Fax:  [phone]  Name: Monique Ellison MRN: [id number] Date of Birth: 21-Sep-1985

## 2020-09-21 ENCOUNTER — Ambulatory Visit: Payer: PRIVATE HEALTH INSURANCE | Attending: Occupational Medicine

## 2020-09-21 ENCOUNTER — Other Ambulatory Visit: Payer: Self-pay

## 2020-09-21 ENCOUNTER — Other Ambulatory Visit (HOSPITAL_COMMUNITY): Payer: Self-pay

## 2020-09-21 DIAGNOSIS — R252 Cramp and spasm: Secondary | ICD-10-CM | POA: Diagnosis present

## 2020-09-21 DIAGNOSIS — M545 Low back pain, unspecified: Secondary | ICD-10-CM | POA: Insufficient documentation

## 2020-09-21 MED ORDER — ESCITALOPRAM OXALATE 10 MG PO TABS
ORAL_TABLET | Freq: Every day | ORAL | 0 refills | Status: DC
  Filled 2020-09-21: qty 90, 90d supply, fill #0

## 2020-09-21 NOTE — Therapy (Signed)
[facility] [address] [place], [place], [address] Phone: [phone]   Fax:  [phone]  Physical Therapy Treatment  Patient Details  Name: Monique Ellison MRN: [id number] Date of Birth: [birth year] Referring Provider (PT): Elaina Pattee, MD   Encounter Date: 09/21/2020   PT End of Session - 09/21/20 1644     Visit Number 6    Date for PT Re-Evaluation 10/28/20    Authorization Type W/C: 9 visits approved    Authorization - Visit Number 6    Authorization - Number of Visits 9    PT Start Time 1619    PT Stop Time 4970    PT Time Calculation (min) 20 min    Activity Tolerance Patient tolerated treatment well    Behavior During Therapy [facility] for tasks assessed/performed             Past Medical History:  Diagnosis Date   Family history of heart disease 05/29/2015   Father had MI in 28's   Former smoker 05/29/2015   Quit 2008   GERD (gastroesophageal reflux disease) 05/29/2015   Seasonal allergies     Past Surgical History:  Procedure Laterality Date   WISDOM TOOTH EXTRACTION      There were no vitals filed for this visit.   Subjective Assessment - 09/21/20 1622     Subjective I got my dates mixed up.  I see the MD tomorrow.  I'm really doing well.  Every once in a while, I tweak my back but otherwise doing well.  98% overall improvement.    Currently in Pain? No/denies                [facility] PT Assessment - 09/21/20 0001       Assessment   Medical Diagnosis low back strain    Referring Provider (PT) Geoffry Paradise, Kristine Royal, MD    Onset Date/Surgical Date 08/21/20      Cognition   Overall Cognitive Status Within Functional Limits for tasks assessed      Observation/Other Assessments   Focus on Therapeutic Outcomes (FOTO)  76- goal met      Strength   Overall Strength Within functional limits for tasks performed                                   PT Education -  09/21/20 1647     Education Details review of all HEP and body mechanics    Person(s) Educated Patient    Methods Explanation    Comprehension Verbalized understanding              PT Short Term Goals - 09/16/20 1624       PT SHORT TERM GOAL #2   Title report < or = to 4/10 LBP after walking her dog    Status Achieved               PT Long Term Goals - 09/21/20 1624       PT LONG TERM GOAL #3   Title stand with Rt=Lt weightbearing x 1 hour without limitation for home and work tasks    Status Achieved      PT Dickey #4   Title report < or = to 3/10 LBP with walking her dog    Status Achieved      PT LONG TERM GOAL #5   Title verbalize  and demonstrate body mechanics modifications for lumbar safety to allow for lifting and return to work    Status Achieved      PT LONG TERM GOAL #6   Title reduce LBP and improve lumbopelvic strength 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  She is consistent and compliant with her HEP and all goals have been met with exception of her goal of returning to work.  She will see MD tomorrow to discuss release to work with probable work shift this weekend if she is released.  Pt will likely call to move her next PT session to 2-3 weeks from now to allow her to get back to work and address any pain or issues that might come from that.  PT reviewed all HEP and body mechanics modifications with pt today.    PT Treatment/Interventions ADLs/Self Care Home Management;Cryotherapy;Electrical Stimulation;Moist Heat;Traction;Gait training;Stair training;Functional mobility training;Therapeutic activities;Therapeutic exercise;Balance training;Neuromuscular re-education;Manual techniques;Patient/family education;Passive range of motion;Dry needling;Joint  Manipulations;Spinal Manipulations;Taping    PT Next Visit Plan see what MD says.  Probable place on hold or push next appt out to 2-3 weeks from now to allow for return to work.    PT Home Exercise Plan Access Code: [phone]    Recommended Other Services initial cert is signed    Consulted and Agree with Plan of Care Patient             Patient will benefit from skilled therapeutic intervention in order to improve the following deficits and impairments:  Abnormal gait, Decreased activity tolerance, Decreased mobility, Decreased strength, Postural dysfunction, Improper body mechanics, Impaired flexibility, Pain, Increased muscle spasms, Decreased endurance, Difficulty walking  Visit Diagnosis: Acute bilateral low back pain without sciatica  Cramp and spasm     Problem List Patient Active Problem List   Diagnosis Date Noted   Family history of heart disease 08/16/2017   Anxiety 02/23/2017   Tension headache 08/22/2016   GERD (gastroesophageal reflux disease) 05/29/2015   Former smoker 05/29/2015    Sigurd Sos, PT 09/21/20 4:47 PM   [facility] [address] [place], [place], [address] Phone: [phone]   Fax:  [phone]  Name: Monique Ellison MRN: [id number] Date of Birth: [birth year]

## 2020-09-24 ENCOUNTER — Ambulatory Visit: Payer: PRIVATE HEALTH INSURANCE | Admitting: Physical Therapy

## 2020-09-29 ENCOUNTER — Ambulatory Visit: Payer: PRIVATE HEALTH INSURANCE | Admitting: Physical Therapy

## 2020-09-29 DIAGNOSIS — F411 Generalized anxiety disorder: Secondary | ICD-10-CM | POA: Diagnosis not present

## 2020-10-01 ENCOUNTER — Ambulatory Visit: Payer: PRIVATE HEALTH INSURANCE | Admitting: Physical Therapy

## 2020-11-09 ENCOUNTER — Other Ambulatory Visit (HOSPITAL_COMMUNITY): Payer: Self-pay

## 2020-11-09 MED FILL — Norgestimate-Eth Estrad Tab 0.18-35/0.215-35/0.25-35 MG-MCG: ORAL | 84 days supply | Qty: 84 | Fill #2 | Status: AC

## 2020-11-17 DIAGNOSIS — M5431 Sciatica, right side: Secondary | ICD-10-CM | POA: Diagnosis not present

## 2020-11-17 DIAGNOSIS — M4726 Other spondylosis with radiculopathy, lumbar region: Secondary | ICD-10-CM | POA: Diagnosis not present

## 2020-11-18 ENCOUNTER — Other Ambulatory Visit (HOSPITAL_COMMUNITY): Payer: Self-pay

## 2020-11-19 ENCOUNTER — Other Ambulatory Visit (HOSPITAL_COMMUNITY): Payer: Self-pay

## 2020-11-19 MED ORDER — GABAPENTIN 100 MG PO CAPS
ORAL_CAPSULE | ORAL | 1 refills | Status: DC
  Filled 2020-11-19: qty 30, 30d supply, fill #0
  Filled 2020-12-19: qty 30, 30d supply, fill #1

## 2020-12-01 DIAGNOSIS — F411 Generalized anxiety disorder: Secondary | ICD-10-CM | POA: Diagnosis not present

## 2020-12-08 ENCOUNTER — Other Ambulatory Visit: Payer: Self-pay | Admitting: Orthopedic Surgery

## 2020-12-08 DIAGNOSIS — M5431 Sciatica, right side: Secondary | ICD-10-CM

## 2020-12-19 ENCOUNTER — Other Ambulatory Visit (HOSPITAL_COMMUNITY): Payer: Self-pay

## 2020-12-20 ENCOUNTER — Other Ambulatory Visit: Payer: BC Managed Care – PPO

## 2020-12-22 ENCOUNTER — Ambulatory Visit (HOSPITAL_BASED_OUTPATIENT_CLINIC_OR_DEPARTMENT_OTHER): Payer: BC Managed Care – PPO | Admitting: Nurse Practitioner

## 2020-12-30 ENCOUNTER — Other Ambulatory Visit (HOSPITAL_COMMUNITY): Payer: Self-pay

## 2020-12-30 ENCOUNTER — Other Ambulatory Visit: Payer: Self-pay | Admitting: Osteopathic Medicine

## 2020-12-30 MED ORDER — GABAPENTIN 100 MG PO CAPS
ORAL_CAPSULE | ORAL | 0 refills | Status: DC
  Filled 2020-12-30: qty 30, fill #0

## 2020-12-30 MED ORDER — ESCITALOPRAM OXALATE 10 MG PO TABS
10.0000 mg | ORAL_TABLET | Freq: Every day | ORAL | 0 refills | Status: DC
  Filled 2020-12-30: qty 90, 90d supply, fill #0

## 2021-01-18 ENCOUNTER — Ambulatory Visit
Admission: RE | Admit: 2021-01-18 | Discharge: 2021-01-18 | Disposition: A | Discharge status: Home / Self Care | Payer: BC Managed Care – PPO | Source: Ambulatory Visit | Attending: Orthopedic Surgery | Admitting: Orthopedic Surgery

## 2021-01-18 DIAGNOSIS — M5431 Sciatica, right side: Secondary | ICD-10-CM

## 2021-01-18 DIAGNOSIS — M79604 Pain in right leg: Secondary | ICD-10-CM | POA: Diagnosis not present

## 2021-01-18 DIAGNOSIS — M545 Low back pain, unspecified: Secondary | ICD-10-CM | POA: Diagnosis not present

## 2021-01-20 ENCOUNTER — Other Ambulatory Visit (HOSPITAL_COMMUNITY): Payer: Self-pay

## 2021-01-20 DIAGNOSIS — N83299 Other ovarian cyst, unspecified side: Secondary | ICD-10-CM | POA: Diagnosis not present

## 2021-01-20 DIAGNOSIS — Z3040 Encounter for surveillance of contraceptives, unspecified: Secondary | ICD-10-CM | POA: Diagnosis not present

## 2021-01-20 MED ORDER — BLISOVI 24 FE 1-20 MG-MCG(24) PO TABS
ORAL_TABLET | ORAL | 4 refills | Status: DC
  Filled 2021-01-20: qty 84, 84d supply, fill #0
  Filled 2021-04-19 – 2021-09-07 (×2): qty 84, 84d supply, fill #1

## 2021-01-22 DIAGNOSIS — M4726 Other spondylosis with radiculopathy, lumbar region: Secondary | ICD-10-CM | POA: Diagnosis not present

## 2021-01-22 DIAGNOSIS — M5431 Sciatica, right side: Secondary | ICD-10-CM | POA: Diagnosis not present

## 2021-01-22 DIAGNOSIS — Z6825 Body mass index (BMI) 25.0-25.9, adult: Secondary | ICD-10-CM | POA: Diagnosis not present

## 2021-02-09 ENCOUNTER — Ambulatory Visit (HOSPITAL_BASED_OUTPATIENT_CLINIC_OR_DEPARTMENT_OTHER): Payer: BC Managed Care – PPO | Admitting: Nurse Practitioner

## 2021-02-17 ENCOUNTER — Ambulatory Visit (HOSPITAL_BASED_OUTPATIENT_CLINIC_OR_DEPARTMENT_OTHER): Payer: BC Managed Care – PPO | Admitting: Nurse Practitioner

## 2021-02-18 ENCOUNTER — Encounter (HOSPITAL_COMMUNITY): Payer: Self-pay | Admitting: Radiology

## 2021-03-04 ENCOUNTER — Ambulatory Visit (HOSPITAL_BASED_OUTPATIENT_CLINIC_OR_DEPARTMENT_OTHER): Payer: BC Managed Care – PPO | Admitting: Nurse Practitioner

## 2021-03-04 ENCOUNTER — Encounter (HOSPITAL_BASED_OUTPATIENT_CLINIC_OR_DEPARTMENT_OTHER): Payer: Self-pay | Admitting: Nurse Practitioner

## 2021-03-04 ENCOUNTER — Other Ambulatory Visit (HOSPITAL_COMMUNITY): Payer: Self-pay

## 2021-03-04 ENCOUNTER — Other Ambulatory Visit: Payer: Self-pay

## 2021-03-04 VITALS — BP 108/64 | HR 62 | Temp 98.4°F | Resp 18 | Ht 61.0 in | Wt 149.4 lb

## 2021-03-04 DIAGNOSIS — Z13220 Encounter for screening for lipoid disorders: Secondary | ICD-10-CM | POA: Diagnosis not present

## 2021-03-04 DIAGNOSIS — F419 Anxiety disorder, unspecified: Secondary | ICD-10-CM

## 2021-03-04 DIAGNOSIS — Z130 Encounter for screening for diseases of the blood and blood-forming organs and certain disorders involving the immune mechanism: Secondary | ICD-10-CM | POA: Diagnosis not present

## 2021-03-04 DIAGNOSIS — Z1329 Encounter for screening for other suspected endocrine disorder: Secondary | ICD-10-CM

## 2021-03-04 DIAGNOSIS — Z1321 Encounter for screening for nutritional disorder: Secondary | ICD-10-CM

## 2021-03-04 DIAGNOSIS — Z13228 Encounter for screening for other metabolic disorders: Secondary | ICD-10-CM

## 2021-03-04 MED ORDER — ESCITALOPRAM OXALATE 10 MG PO TABS
10.0000 mg | ORAL_TABLET | Freq: Every day | ORAL | 3 refills | Status: DC
  Filled 2021-03-04: qty 90, 90d supply, fill #0
  Filled 2021-07-11: qty 90, 90d supply, fill #1
  Filled 2021-10-05: qty 90, 90d supply, fill #2
  Filled 2022-01-20: qty 90, 90d supply, fill #3

## 2021-03-04 NOTE — Patient Instructions (Signed)
Thank you for choosing [facility] for your Primary Care needs. I am excited for the opportunity to partner with you to meet your health care goals. It was great to see you today! ° °Recommendations from today's visit: °I've sent in Lexapro for a year for you °I ordered labs for July- if you have them done for insurance, let me know and we can cancel these.  °Please let me know if you have any questions or concerns ° °Information on diet, exercise, and health maintenance recommendations are listed below. This is information to help you be sure you are on track for optimal health and monitoring.  ° °Please look over this and let us know if you have any questions or if you have completed any of the health maintenance outside of [facility] so that we can be sure your records are up to date.  °___________________________________________________________ °About Me: °I am an Adult-Geriatric Nurse Practitioner with a background in caring for patients for more than 20 years with a strong intensive care background. I provide primary care and sports medicine services to patients age 13 and older within this office. My education had a strong focus on caring for the older adult population, which I am passionate about. I am also the director of the [employer] with [employer].  ° °My desire is to provide you with the best service through preventive medicine and supportive care. I consider you a part of the medical team and value your input. I work diligently to ensure that you are heard and your needs are met in a safe and effective manner. I want you to feel comfortable with me as your provider and want you to know that your health concerns are important to me. ° °For your information, our office hours are: °Monday, Tuesday, and Thursday 8:00 AM - 5:00 PM °Wednesday and Friday 8:00 AM - 12:00 PM.  ° °In my time away from the office I am teaching new APP's within the system and am  unavailable, but my partner, Dr. deCuba is in the office for emergent needs.  ° °If you have questions or concerns, please call our office at [phone] or send us a MyChart message and we will respond as quickly as possible.  °____________________________________________________________ °MyChart:  °For all urgent or time sensitive needs we ask that you please call the office to avoid delays. Our number is [phone]. °MyChart is not constantly monitored and due to the large volume of messages a day, replies may take up to 72 business hours. ° °MyChart Policy: °MyChart allows for you to see your visit notes, after visit summary, provider recommendations, lab and tests results, make an appointment, request refills, and contact your provider or the office for non-urgent questions or concerns. Providers are seeing patients during normal business hours and do not have built in time to review MyChart messages.  °We ask that you allow a minimum of 3 business days for responses to MyChart messages. For this reason, please do not send urgent requests through MyChart. Please call the office at [phone]. °New and ongoing conditions may require a visit. We have virtual and in person visit available for your convenience.  °Complex MyChart concerns may require a visit. Your provider may request you schedule a virtual or in person visit to ensure we are providing the best care possible. °MyChart messages sent after 11:00 AM on Friday will not be received by the provider until Monday morning.  °  °  Lab and Test Results: °You will receive your lab and test results on MyChart as soon as they are completed and results have been sent by the lab or testing facility. Due to this service, you will receive your results BEFORE your provider.  °I review lab and tests results each morning prior to seeing patients. Some results require collaboration with other providers to ensure you are receiving the most appropriate care. For this  reason, we ask that you please allow a minimum of 3-5 business days from the time the ALL results have been received for your provider to receive and review lab and test results and contact you about these.  °Most lab and test result comments from the provider will be sent through MyChart. Your provider may recommend changes to the plan of care, follow-up visits, repeat testing, ask questions, or request an office visit to discuss these results. You may reply directly to this message or call the office at [phone] to provide information for the provider or set up an appointment. °In some instances, you will be called with test results and recommendations. Please let us know if this is preferred and we will make note of this in your chart to provide this for you.    °If you have not heard a response to your lab or test results in 5 business days from all results returning to MyChart, please call the office to let us know. We ask that you please avoid calling prior to this time unless there is an emergent concern. Due to high call volumes, this can delay the resulting process. ° °After Hours: °For all non-emergency after hours needs, please call the office at [phone] and select the option to reach the on-call provider service. On-call services are shared between multiple [facility] offices and therefore it will not be possible to speak directly with your provider. On-call providers may provide medical advice and recommendations, but are unable to provide refills for maintenance medications.  °For all emergency or urgent medical needs after normal business hours, we recommend that you seek care at the closest Urgent Care or Emergency Department to ensure appropriate treatment in a timely manner.  °[facility] has a 24 hour emergency room located on the ground floor for your convenience.  ° °Urgent Concerns During the Business Day °Providers are seeing patients from 8AM to 5PM with a  busy schedule and are most often not able to respond to non-urgent calls until the end of the day or the next business day. °If you should have URGENT concerns during the day, please call and speak to the nurse or schedule a same day appointment so that we can address your concern without delay.  ° °Thank you, again, for choosing me as your health care partner. I appreciate your trust and look forward to learning more about you.  ° °SaraBeth Early, DNP, AGNP-c °___________________________________________________________ ° °Health Maintenance Recommendations °Screening Testing °Mammogram °Every 1 -2 years based on history and risk factors °Starting at age 40 °Pap Smear °Ages 21-39 every 3 years °Ages 30-65 every 5 years with HPV testing °More frequent testing may be required based on results and history °Colon Cancer Screening °Every 1-10 years based on test performed, risk factors, and history °Starting at age 45 °Bone Density Screening °Every 2-10 years based on history °Starting at age 65 for women °Recommendations for men differ based on medication usage, history, and risk factors °AAA Screening °One time ultrasound °Men 65-75 years old who have   every smoked Lung Cancer Screening Low Dose Lung CT every 12 months Age 27-80 years with a 30 pack-year smoking history who still smoke or who have quit within the last 15 years  Screening Labs Routine  Labs: Complete Blood Count (CBC), Complete Metabolic Panel (CMP), Cholesterol (Lipid Panel) Every 6-12 months based on history and medications May be recommended more frequently based on current conditions or previous results Hemoglobin A1c Lab Every 3-12 months based on history and previous results Starting at age 22 or earlier with diagnosis of diabetes, high cholesterol, BMI >26, and/or risk factors Frequent monitoring for patients with diabetes to ensure blood sugar control Thyroid Panel (TSH w/ T3 & T4) Every 6 months based on history, symptoms, and risk  factors May be repeated more often if on medication HIV One time testing for all patients 31 and older May be repeated more frequently for patients with increased risk factors or exposure Hepatitis C One time testing for all patients 70 and older May be repeated more frequently for patients with increased risk factors or exposure Gonorrhea, Chlamydia Every 12 months for all sexually active persons 13-24 years Additional monitoring may be recommended for those who are considered high risk or who have symptoms PSA Men 78-27 years old with risk factors Additional screening may be recommended from age 57-69 based on risk factors, symptoms, and history  Vaccine Recommendations Tetanus Booster All adults every 10 years Flu Vaccine All patients 6 months and older every year COVID Vaccine All patients 12 years and older Initial dosing with booster May recommend additional booster based on age and health history HPV Vaccine 2 doses all patients age 27-26 Dosing may be considered for patients over 26 Shingles Vaccine (Shingrix) 2 doses all adults 40 years and older Pneumonia (Pneumovax 23) All adults 62 years and older May recommend earlier dosing based on health history Pneumonia (Prevnar 77) All adults 38 years and older Dosed 1 year after Pneumovax 23  Additional Screening, Testing, and Vaccinations may be recommended on an individualized basis based on family history, health history, risk factors, and/or exposure.  __________________________________________________________  Diet Recommendations for All Patients  I recommend that all patients maintain a diet low in saturated fats, carbohydrates, and cholesterol. While this can be challenging at first, it is not impossible and small changes can make big differences.  Things to try: Decreasing the amount of soda, sweet tea, and/or juice to one or less per day and replace with water While water is always the first choice, if you do  not like water you may consider adding a water additive without sugar to improve the taste other sugar free drinks Replace potatoes with a brightly colored vegetable at dinner Use healthy oils, such as canola oil or olive oil, instead of butter or hard margarine Limit your bread intake to two pieces or less a day Replace regular pasta with low carb pasta options Bake, broil, or grill foods instead of frying Monitor portion sizes  Eat smaller, more frequent meals throughout the day instead of large meals  An important thing to remember is, if you love foods that are not great for your health, you don't have to give them up completely. Instead, allow these foods to be a reward when you have done well. Allowing yourself to still have special treats every once in a while is a nice way to tell yourself thank you for working hard to keep yourself healthy.   Also remember that every day is a new day.  If you have a bad day and "fall off the wagon", you can still climb right back up and keep moving along on your journey!  We have resources available to help you!  Some websites that may be helpful include: www.http://carter.biz/  Www.VeryWellFit.com _____________________________________________________________  Activity Recommendations for All Patients  I recommend that all adults get at least 20 minutes of moderate physical activity that elevates your heart rate at least 5 days out of the week.  Some examples include: Walking or jogging at a pace that allows you to carry on a conversation Cycling (stationary bike or outdoors) Water aerobics Yoga Weight lifting Dancing If physical limitations prevent you from putting stress on your joints, exercise in a pool or seated in a chair are excellent options.  Do determine your MAXIMUM heart rate for activity: YOUR AGE - 220 = MAX HeartRate   Remember! Do not push yourself too hard.  Start slowly and build up your pace, speed, weight, time in exercise,  etc.  Allow your body to rest between exercise and get good sleep. You will need more water than normal when you are exerting yourself. Do not wait until you are thirsty to drink. Drink with a purpose of getting in at least 8, 8 ounce glasses of water a day plus more depending on how much you exercise and sweat.    If you begin to develop dizziness, chest pain, abdominal pain, jaw pain, shortness of breath, headache, vision changes, lightheadedness, or other concerning symptoms, stop the activity and allow your body to rest. If your symptoms are severe, seek emergency evaluation immediately. If your symptoms are concerning, but not severe, please let us know so that we can recommend further evaluation.

## 2021-03-04 NOTE — Progress Notes (Signed)
Tollie EthSara E Tashauna Caisse, DNP, AGNP-c Primary Care & Sports Medicine [address]   [address] [place], [place] [id number] [phone]  New patient visit   Patient: Monique Ellison   DOB: [birth year]   36 y.o. Female  MRN: [id number] Visit Date: 03/04/2021  Patient Care Team: Tambria Pfannenstiel, Sung AmabileSara E, NP as PCP - General (Nurse Practitioner)  Today's healthcare provider: Tollie EthSara E. Jeanette Moffatt, NP   Chief Complaint  Patient presents with   New Patient (Initial Visit)   Subjective    HPI  Monique Daftlizabeth H Brozowski is a 36 y.o. female who presents today as a new patient to establish care.    Monique Ellison reports a history of anxiety and depression symptoms which are currently well controlled on Lexapro 10 mg/day. Monique Ellison has no other significant health history. Monique Ellison is married with no children. 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   Monique Ellison endorses gaining some weight over COVID but is currently working to help lose some of the increased weight and working to improve her diet.  Monique Ellison would like to hold off on labs until Monique Ellison has a chance to work on these a little bit.  Past Medical History:  Diagnosis Date   Family history of heart disease 05/29/2015   Father had MI in 340's   Former smoker 05/29/2015   Quit 2008   GERD (gastroesophageal reflux disease) 05/29/2015   Seasonal allergies    Past Surgical History:  Procedure Laterality Date   WISDOM TOOTH EXTRACTION     Family Status  Relation Name Status   Mother  Alive   Father  Alive   Brother  (Not Specified)   MGM  (Not Specified)   Family History  Problem Relation Age of Onset   Diabetes Mother    Hyperlipidemia Mother    Stroke Mother    Hypertension Father    Stroke Father    Heart attack Father    Depression Brother    Diabetes Maternal Grandmother    Social History   Socioeconomic History   Marital status: Married    Spouse name: Not on file    Number of children: Not on file   Years of education: Not on file   Highest education level: Not on file  Occupational History   Not on file  Tobacco Use   Smoking status: Never   Smokeless tobacco: Never  Substance and Sexual Activity   Alcohol use: No   Drug use: Not on file   Sexual activity: Not on file  Other Topics Concern   Not on file  Social History Narrative   Not on file   Social Determinants of Health   Financial Resource Strain: Not on file  Food Insecurity: Not on file  Transportation Needs: Not on file  Physical Activity: Not on file  Stress: Not on file  Social Connections: Not on file   Outpatient Medications Prior to Visit  Medication Sig   loratadine (CLARITIN) 10 MG tablet Take 1 tablet (10 mg total) by mouth daily.   Norethindrone Acetate-Ethinyl Estrad-FE (BLISOVI 24 FE) 1-20 MG-MCG(24) tablet Take 1 tablet by mouth every day.   [DISCONTINUED] escitalopram (LEXAPRO) 10 MG tablet Take 1 tablet by mouth daily. NO REFILLS. NEEDS TO TRANSITION CARE TO NEW PCP.   [DISCONTINUED] Norgestimate-Ethinyl Estradiol Triphasic (TRI-SPRINTEC) 0.18/0.215/0.25 MG-35 MCG tablet Take 1 tablet by mouth daily.   [DISCONTINUED] ALPRAZolam (XANAX) 0.5 MG  tablet Take 0.5-1 tablets (0.25-0.5 mg total) by mouth 3 (three) times daily as needed for anxiety.   [DISCONTINUED] cephALEXin (KEFLEX) 500 MG capsule Take 1 capsule by mouth twice a day   [DISCONTINUED] cyclobenzaprine (FLEXERIL) 10 MG tablet Take 0.5-1 tablets (5-10 mg total) by mouth 3 (three) times daily as needed for muscle spasms. Caution: can cause drowsiness   [DISCONTINUED] gabapentin (NEURONTIN) 100 MG capsule Take 1 capsule by mouth at bedtime   [DISCONTINUED] naproxen (NAPROSYN) 500 MG tablet Take 1 tablet (500 mg total) by mouth 2 (two) times daily with a meal.   [DISCONTINUED] Norgestimate-Ethinyl Estradiol Triphasic 0.18/0.215/0.25 MG-35 MCG tablet TAKE 1 TABLET BY MOUTH EVERY DAY   [DISCONTINUED]  Norgestimate-Ethinyl Estradiol Triphasic 0.18/0.215/0.25 MG-35 MCG tablet TAKE 1 TABLET BY MOUTH EVERY DAY   [DISCONTINUED] tiZANidine (ZANAFLEX) 2 MG tablet Take 1 tablet (2 mg total) by mouth every 8 (eight) hours as needed for muscle spasms.   No facility-administered medications prior to visit.   Not on File  Immunization History  Administered Date(s) Administered   Influenza-Unspecified 12/08/2016, 12/19/2018, 11/21/2020    Health Maintenance  Topic Date Due   HIV Screening  Never done   Hepatitis C Screening  Never done   TETANUS/TDAP  Never done   PAP SMEAR-Modifier  11/19/2021   INFLUENZA VACCINE  Completed   Pneumococcal Vaccine 79-3 Years old  Aged Out   HPV VACCINES  Aged Out    Patient Care Team: Nicci Vaughan, Sung Amabile, NP as PCP - General (Nurse Practitioner)  Review of Systems All review of systems negative except what is listed in the HPI   Objective    BP 108/64    Pulse 62    Temp 98.4 F (36.9 C)    Resp 18    Ht 5\' 1"  (1.549 m)    Wt 149 lb 6.4 oz (67.8 kg)    LMP  (LMP Unknown)    BMI 28.23 kg/m  Physical Exam Vitals and nursing note reviewed.  Constitutional:      General: Monique Ellison is not in acute distress.    Appearance: Normal appearance.  HENT:     Head: Normocephalic and atraumatic.     Right Ear: Hearing, tympanic membrane, ear canal and external ear normal.     Left Ear: Hearing, tympanic membrane, ear canal and external ear normal.     Nose: Nose normal.     Right Sinus: No maxillary sinus tenderness or frontal sinus tenderness.     Left Sinus: No maxillary sinus tenderness or frontal sinus tenderness.     Mouth/Throat:     Lips: Pink.     Mouth: Mucous membranes are moist.     Pharynx: Oropharynx is clear.  Eyes:     General: Lids are normal. Vision grossly intact.     Extraocular Movements: Extraocular movements intact.     Conjunctiva/sclera: Conjunctivae normal.     Pupils: Pupils are equal, round, and reactive to light.     Funduscopic  exam:    Right eye: Red reflex present.        Left eye: Red reflex present.    Visual Fields: Right eye visual fields normal and left eye visual fields normal.  Neck:     Thyroid: No thyromegaly.     Vascular: No carotid bruit.  Cardiovascular:     Rate and Rhythm: Normal rate and regular rhythm.     Chest Wall: PMI is not displaced.     Pulses: Normal pulses.  Dorsalis pedis pulses are 2+ on the right side and 2+ on the left side.       Posterior tibial pulses are 2+ on the right side and 2+ on the left side.     Heart sounds: Normal heart sounds. No murmur heard. Pulmonary:     Effort: Pulmonary effort is normal. No respiratory distress.     Breath sounds: Normal breath sounds.  Abdominal:     General: Abdomen is flat. Bowel sounds are normal. There is no distension.     Palpations: Abdomen is soft. There is no hepatomegaly, splenomegaly or mass.     Tenderness: There is no abdominal tenderness. There is no right CVA tenderness, left CVA tenderness, guarding or rebound.  Musculoskeletal:        General: Normal range of motion.     Cervical back: Full passive range of motion without pain, normal range of motion and neck supple. No tenderness.     Right lower leg: No edema.     Left lower leg: No edema.  Feet:     Left foot:     Toenail Condition: Left toenails are normal.  Lymphadenopathy:     Cervical: No cervical adenopathy.     Upper Body:     Right upper body: No supraclavicular adenopathy.     Left upper body: No supraclavicular adenopathy.  Skin:    General: Skin is warm and dry.     Capillary Refill: Capillary refill takes less than 2 seconds.     Nails: There is no clubbing.  Neurological:     General: No focal deficit present.     Mental Status: Monique Ellison is alert and oriented to person, place, and time.     GCS: GCS eye subscore is 4. GCS verbal subscore is 5. GCS motor subscore is 6.     Sensory: Sensation is intact.     Motor: Motor function is intact.      Coordination: Coordination is intact.     Gait: Gait is intact.     Deep Tendon Reflexes: Reflexes are normal and symmetric.  Psychiatric:        Attention and Perception: Attention normal.        Mood and Affect: Mood normal.        Speech: Speech normal.        Behavior: Behavior normal. Behavior is cooperative.        Thought Content: Thought content normal.        Cognition and Memory: Cognition and memory normal.        Judgment: Judgment normal.    Depression Screen PHQ 2/9 Scores 03/04/2021 10/02/2018 08/16/2017 03/15/2017  PHQ - 2 Score 0 0 0 0  PHQ- 9 Score - - 2 2   No results found for any visits on 03/04/21.  Assessment & Plan      Problem List Items Addressed This Visit     Anxiety - Primary    Longstanding history of anxiety well-controlled on Lexapro 10 mg/day. No changes to medication at today.  Refills provided for 12 months. Recommend patient contact the office if Monique Ellison begins to experience exacerbation of symptoms or new symptoms present.      Relevant Medications   escitalopram (LEXAPRO) 10 MG tablet   Other Visit Diagnoses     Screening for deficiency anemia       Relevant Orders   CBC with Differential/Platelet   Comprehensive metabolic panel   Lipid panel   Hemoglobin A1c  TSH   VITAMIN D 25 Hydroxy (Vit-D Deficiency, Fractures)   Screening for lipid disorders       Relevant Orders   CBC with Differential/Platelet   Comprehensive metabolic panel   Lipid panel   Hemoglobin A1c   TSH   VITAMIN D 25 Hydroxy (Vit-D Deficiency, Fractures)   Screening for thyroid disorder       Relevant Orders   CBC with Differential/Platelet   Comprehensive metabolic panel   Lipid panel   Hemoglobin A1c   TSH   VITAMIN D 25 Hydroxy (Vit-D Deficiency, Fractures)   Screening for endocrine, nutritional, metabolic and immunity disorder       Relevant Orders   CBC with Differential/Platelet   Comprehensive metabolic panel   Lipid panel   Hemoglobin A1c   TSH    VITAMIN D 25 Hydroxy (Vit-D Deficiency, Fractures)        Return in about 6 months (around 09/01/2021) for Labs.      Rand Etchison, Sung Amabile, NP, DNP, AGNP-C Primary Care & Sports Medicine at [facility]

## 2021-03-04 NOTE — Assessment & Plan Note (Signed)
Longstanding history of anxiety well-controlled on Lexapro 10 mg/day. No changes to medication at today.  Refills provided for 12 months. Recommend patient contact the office if she begins to experience exacerbation of symptoms or new symptoms present.

## 2021-03-08 ENCOUNTER — Other Ambulatory Visit (HOSPITAL_COMMUNITY): Payer: Self-pay

## 2021-03-16 DIAGNOSIS — R1904 Left lower quadrant abdominal swelling, mass and lump: Secondary | ICD-10-CM | POA: Diagnosis not present

## 2021-03-23 ENCOUNTER — Ambulatory Visit (HOSPITAL_BASED_OUTPATIENT_CLINIC_OR_DEPARTMENT_OTHER): Payer: BC Managed Care – PPO | Admitting: Nurse Practitioner

## 2021-03-24 ENCOUNTER — Other Ambulatory Visit (HOSPITAL_COMMUNITY): Payer: Self-pay

## 2021-03-24 DIAGNOSIS — Z1151 Encounter for screening for human papillomavirus (HPV): Secondary | ICD-10-CM | POA: Diagnosis not present

## 2021-03-24 DIAGNOSIS — Z6827 Body mass index (BMI) 27.0-27.9, adult: Secondary | ICD-10-CM | POA: Diagnosis not present

## 2021-03-24 DIAGNOSIS — Z01419 Encounter for gynecological examination (general) (routine) without abnormal findings: Secondary | ICD-10-CM | POA: Diagnosis not present

## 2021-03-24 DIAGNOSIS — Z124 Encounter for screening for malignant neoplasm of cervix: Secondary | ICD-10-CM | POA: Diagnosis not present

## 2021-03-24 MED ORDER — NORETHIN ACE-ETH ESTRAD-FE 1-20 MG-MCG(24) PO TABS
ORAL_TABLET | ORAL | 4 refills | Status: DC
  Filled 2021-03-24 – 2021-04-19 (×2): qty 84, 84d supply, fill #0
  Filled 2021-07-12: qty 84, 84d supply, fill #1
  Filled 2021-09-29: qty 84, 84d supply, fill #2
  Filled 2021-09-29: qty 28, 28d supply, fill #2
  Filled 2021-09-29: qty 84, 84d supply, fill #2
  Filled 2021-12-22: qty 84, 84d supply, fill #3
  Filled 2022-03-21: qty 84, 84d supply, fill #4

## 2021-04-01 ENCOUNTER — Other Ambulatory Visit (HOSPITAL_COMMUNITY): Payer: Self-pay

## 2021-04-12 DIAGNOSIS — F411 Generalized anxiety disorder: Secondary | ICD-10-CM | POA: Diagnosis not present

## 2021-04-12 DIAGNOSIS — F32A Depression, unspecified: Secondary | ICD-10-CM | POA: Diagnosis not present

## 2021-04-19 ENCOUNTER — Other Ambulatory Visit (HOSPITAL_COMMUNITY): Payer: Self-pay

## 2021-04-29 DIAGNOSIS — F411 Generalized anxiety disorder: Secondary | ICD-10-CM | POA: Diagnosis not present

## 2021-04-29 DIAGNOSIS — F32A Depression, unspecified: Secondary | ICD-10-CM | POA: Diagnosis not present

## 2021-05-10 DIAGNOSIS — F32A Depression, unspecified: Secondary | ICD-10-CM | POA: Diagnosis not present

## 2021-05-10 DIAGNOSIS — F411 Generalized anxiety disorder: Secondary | ICD-10-CM | POA: Diagnosis not present

## 2021-06-14 DIAGNOSIS — F32A Depression, unspecified: Secondary | ICD-10-CM | POA: Diagnosis not present

## 2021-06-14 DIAGNOSIS — F411 Generalized anxiety disorder: Secondary | ICD-10-CM | POA: Diagnosis not present

## 2021-06-29 DIAGNOSIS — F32A Depression, unspecified: Secondary | ICD-10-CM | POA: Diagnosis not present

## 2021-06-29 DIAGNOSIS — F411 Generalized anxiety disorder: Secondary | ICD-10-CM | POA: Diagnosis not present

## 2021-07-12 ENCOUNTER — Other Ambulatory Visit (HOSPITAL_COMMUNITY): Payer: Self-pay

## 2021-07-14 ENCOUNTER — Other Ambulatory Visit: Payer: Self-pay

## 2021-07-26 DIAGNOSIS — F32A Depression, unspecified: Secondary | ICD-10-CM | POA: Diagnosis not present

## 2021-07-26 DIAGNOSIS — F411 Generalized anxiety disorder: Secondary | ICD-10-CM | POA: Diagnosis not present

## 2021-08-12 DIAGNOSIS — F32A Depression, unspecified: Secondary | ICD-10-CM | POA: Diagnosis not present

## 2021-08-12 DIAGNOSIS — F411 Generalized anxiety disorder: Secondary | ICD-10-CM | POA: Diagnosis not present

## 2021-09-07 ENCOUNTER — Other Ambulatory Visit (HOSPITAL_COMMUNITY): Payer: Self-pay

## 2021-09-13 ENCOUNTER — Ambulatory Visit (HOSPITAL_BASED_OUTPATIENT_CLINIC_OR_DEPARTMENT_OTHER): Payer: BC Managed Care – PPO | Admitting: Family Medicine

## 2021-09-13 ENCOUNTER — Encounter (HOSPITAL_BASED_OUTPATIENT_CLINIC_OR_DEPARTMENT_OTHER): Payer: Self-pay | Admitting: Family Medicine

## 2021-09-13 ENCOUNTER — Other Ambulatory Visit (HOSPITAL_COMMUNITY): Payer: Self-pay

## 2021-09-13 DIAGNOSIS — R42 Dizziness and giddiness: Secondary | ICD-10-CM | POA: Diagnosis not present

## 2021-09-13 MED ORDER — MECLIZINE HCL 25 MG PO TABS: 25.0000 mg | ORAL_TABLET | Freq: Three times a day (TID) | ORAL | 0 refills | Status: DC | PRN

## 2021-09-13 NOTE — Assessment & Plan Note (Signed)
Starting about 5 days ago, patient began to experience dizziness - feels that she did not sleep well the night before and dismissed the symptoms. But did not improve in the subsequent days. Dizziness is worst standing up, less noticeable sitting or laying. No increased dizziness with rolling over in bed or with head position changes.  Did try meclizine the past few days. Trying to stay hydrated. No vision changes, possibly some increased light sensitivity. No fevers. Denies any prior similar symptoms. No recent injury. No gait changes. No numbness or tingling, no motor weakness. No syncopal episodes or pre-sycnopal feelings. Has also had headache - primarily at base of head/neck extending forward. Started same day as dizziness. Ibuprofen has helped with the headache. Denies history of headaches regularly, but does report some migraines rarely in the past. On exam, patient is in no acute distress, vital signs are stable. Eyes with extraocular movements intact, pupils are equal, round and reactive to light.  No tenderness to palpation at base of skull or proximal aspect of cervical spine. She is alert and oriented.  Cranial nerves are intact, normal sensation throughout face.  Normal speech, no visual field deficits.  Dix-Hallpike without reproduction of symptoms, no nystagmus noted.  Normal gait. Discussed potential etiologies for symptoms.  Most likely causes include migraine, BPPV.  Given history, exam, risk factors, underlying stroke or intracranial abnormality such as bleeding or mass are likely.  Did discuss symptoms to be mindful of and if they should develop or worsening and current symptoms occur, recommend emergent evaluation at emergency department.  Patient voiced understanding At this time, will focus treatment on controlling symptoms of headache and dizziness, particularly as possible underlying migraine Proceed with Toradol IM today, will have patient avoid NSAID for the next 24 hours.   May use Tylenol to help with any breakthrough symptoms Discussed option of steroid injection as well to reduce risk of rebound headache, she prefers to avoid this which is reasonable We will provide referral to physical therapy for vestibular rehab

## 2021-09-13 NOTE — Progress Notes (Signed)
    Procedures performed today:    None.  Independent interpretation of notes and tests performed by another provider:   None.  Brief History, Exam, Impression, and Recommendations:    BP 125/78   Pulse 76   Ht 5\' 1"  (1.549 m)   Wt 152 lb 1.6 oz (69 kg)   SpO2 100%   BMI 28.74 kg/m   Dizziness Starting about 5 days ago, patient began to experience dizziness - feels that she did not sleep well the night before and dismissed the symptoms. But did not improve in the subsequent days. Dizziness is worst standing up, less noticeable sitting or laying. No increased dizziness with rolling over in bed or with head position changes.  Did try meclizine the past few days. Trying to stay hydrated. No vision changes, possibly some increased light sensitivity. No fevers. Denies any prior similar symptoms. No recent injury. No gait changes. No numbness or tingling, no motor weakness. No syncopal episodes or pre-sycnopal feelings. Has also had headache - primarily at base of head/neck extending forward. Started same day as dizziness. Ibuprofen has helped with the headache. Denies history of headaches regularly, but does report some migraines rarely in the past. On exam, patient is in no acute distress, vital signs are stable. Eyes with extraocular movements intact, pupils are equal, round and reactive to light.  No tenderness to palpation at base of skull or proximal aspect of cervical spine. She is alert and oriented.  Cranial nerves are intact, normal sensation throughout face.  Normal speech, no visual field deficits.  Dix-Hallpike without reproduction of symptoms, no nystagmus noted.  Normal gait. Discussed potential etiologies for symptoms.  Most likely causes include migraine, BPPV.  Given history, exam, risk factors, underlying stroke or intracranial abnormality such as bleeding or mass are likely.  Did discuss symptoms to be mindful of and if they should develop or worsening and current  symptoms occur, recommend emergent evaluation at emergency department.  Patient voiced understanding At this time, will focus treatment on controlling symptoms of headache and dizziness, particularly as possible underlying migraine Proceed with Toradol IM today, will have patient avoid NSAID for the next 24 hours.  May use Tylenol to help with any breakthrough symptoms Discussed option of steroid injection as well to reduce risk of rebound headache, she prefers to avoid this which is reasonable We will provide referral to physical therapy for vestibular rehab   ___________________________________________ Casey Fye de , MD, ABFM, CAQSM Primary Care and Sports Medicine [facility]

## 2021-09-13 NOTE — Patient Instructions (Signed)

## 2021-09-15 ENCOUNTER — Ambulatory Visit: Payer: BC Managed Care – PPO | Attending: Family Medicine

## 2021-09-15 DIAGNOSIS — R42 Dizziness and giddiness: Secondary | ICD-10-CM | POA: Diagnosis not present

## 2021-09-15 NOTE — Therapy (Signed)
OUTPATIENT PHYSICAL THERAPY VESTIBULAR EVALUATION     Patient Name: STEPHANY POORMAN MRN: [id number] DOB:[birth year], 36 y.o., female Today's Date: 09/15/2021  PCP: de [place], Buren Kos, MD REFERRING PROVIDER: de [place], Raymond J, MD   PT End of Session - 09/15/21 [id number]     Visit Number 1    Number of Visits 1    Date for PT Re-Evaluation 09/16/21    Authorization Type BCBS    PT Start Time 0718    PT Stop Time 0800    PT Time Calculation (min) 42 min    Activity Tolerance Patient tolerated treatment well    Behavior During Therapy [facility] for tasks assessed/performed             Past Medical History:  Diagnosis Date   Family history of heart disease 05/29/2015   Father had MI in 14's   Former smoker 05/29/2015   Quit 2008   GERD (gastroesophageal reflux disease) 05/29/2015   Seasonal allergies    Past Surgical History:  Procedure Laterality Date   WISDOM TOOTH EXTRACTION     Patient Active Problem List   Diagnosis Date Noted   Dizziness 09/13/2021   Abnormal cervical Papanicolaou smear 02/07/2020   Family history of heart disease 08/16/2017   Anxiety 02/23/2017   Panic attack 02/20/2017   Tension headache 08/22/2016   GERD (gastroesophageal reflux disease) 05/29/2015   Former smoker 05/29/2015    ONSET DATE: 09/13/21 (Referral Date)   REFERRING DIAG: R42 (ICD-10-CM) - Dizziness  THERAPY DIAG:  Dizziness and giddiness - Plan: PT plan of care cert/re-cert  Rationale for Evaluation and Treatment Rehabilitation  SUBJECTIVE:   SUBJECTIVE STATEMENT: Patient reports that the dizziness started last Wednesday. Reports having a headache and dizziness that day. No history of Migraines. Friday was peak point of dizziness and had to leave work. Patient reports constant dizziness, feels better when she is laying down. Reports walking down hallway can exacerbate symptoms. Reports everything feels off, does report some room spinning. No vision or hearing changes. Reports has appt  with vision. Denies tinnitus or aural fullness. Tried Epley but did not affect her. Did receive Toradol at last MD Visit, headaches are slightly better. No recent concussion, no recent ear infection. Sometimes does have some light sensitivity with headaches. No nausea.  Pt accompanied by: self  PERTINENT HISTORY: No Significant PMH   PAIN:  Are you having pain? No  PRECAUTIONS: None  WEIGHT BEARING RESTRICTIONS No  FALLS: Has patient fallen in last 6 months? No  LIVING ENVIRONMENT: Lives with: lives with their family Lives in: House/apartment  PLOF: Vocation/Vocational requirements: Nurse . Reports tolerance for looking at computer has decreased  PATIENT GOALS Improve Dizziness  OBJECTIVE:   COGNITION: Overall cognitive status: Within functional limits for tasks assessed   SENSATION: WFL  POSTURE: No Significant postural limitations   Cervical ROM:    Active A/PROM (deg) eval  Flexion WNL  Extension WNL  Right lateral flexion   Left lateral flexion   Right rotation WNL  Left rotation WNL  (Blank rows = not tested)  STRENGTH: WFL  TRANSFERS: Assistive device utilized: None  Sit to stand: Complete Independence Stand to sit: Complete Independence  GAIT: Gait pattern: WFL Distance walked: clinic distance Assistive device utilized: None Level of assistance: Complete Independence Comments:   PATIENT SURVEYS:  FOTO no treatment warranted; staff did not capture   VESTIBULAR ASSESSMENT   SYMPTOM BEHAVIOR:   Subjective history: See Subjective   Non-Vestibular symptoms: headaches  Type of dizziness: Spinning/Vertigo and Unsteady with head/body turns   Frequency: multiple times/week   Duration: constant   Aggravating factors: Induced by motion: turning body quickly, turning head quickly, and activity in general   Relieving factors: lying supine   Progression of symptoms: unchanged   OCULOMOTOR EXAM:   Ocular Alignment: normal   Ocular ROM: No  Limitations   Spontaneous Nystagmus: absent   Gaze-Induced Nystagmus: absent   Smooth Pursuits: intact; reports some mild dizziness and blurry vision going diagonally from left superior to right inferior   Saccades: intact   Convergence/Divergence: 7 cm    VESTIBULAR - OCULAR REFLEX:    Slow VOR: Normal   VOR Cancellation: Normal   Head-Impulse Test: HIT Right: negative HIT Left: negative   Dynamic Visual Acuity: Static: 8 Dynamic: 7 ; mild dizziness and mild HA (around temples)    POSITIONAL TESTING: Right Dix-Hallpike: no nystagmus Left Dix-Hallpike: no nystagmus Right Roll Test: no nystagmus Left Roll Test: no nystagmus    MOTION SENSITIVITY:    Motion Sensitivity Quotient  Intensity: 0 = none, 1 = Lightheaded, 2 = Mild, 3 = Moderate, 4 = Severe, 5 = Vomiting  Intensity  1. Sitting to supine 0  2. Supine to L side 0  3. Supine to R side 0  4. Supine to sitting 0  5. L Hallpike-Dix 0  6. Up from L  1  7. R Hallpike-Dix 0  8. Up from R  0  9. Sitting, head  tipped to L knee 0  10. Head up from L  knee 0  11. Sitting, head  tipped to R knee 0  12. Head up from R  knee 0  13. Sitting head turns x5 2 (very mild dizziness with 5th rep)  14.Sitting head nods x5 0  15. In stance, 180  turn to L  0  16. In stance, 180  turn to R 0     PATIENT EDUCATION: Education details: Educated on no PT warranted at this time as unable to elicit any symptoms; Plan to follow up with ENT Person educated: Patient Education method: Explanation Education comprehension: verbalized understanding  ASSESSMENT:  CLINICAL IMPRESSION: Patient is a 36 y.o. female referred to Neuro OPPT services for Dizziness. No Significant PMH noted in medical history. Upon evaluation, unable to elicit symptoms except very mild dizziness with DVA. Patient has negative HIT bilaterally, normal MSQ, and negative positional testing. Symptoms are presenting consistent with Vestibular Migraines, agreeable to  Dr. De Peru findings. May benefit from further diagnostic testing with an ENT. Patient will benefit from skilled PT services to address impairments.   OBJECTIVE IMPAIRMENTS dizziness.   ACTIVITY LIMITATIONS  none  PARTICIPATION LIMITATIONS: occupation  PERSONAL FACTORS  No  personal factors are affecting patient's functional outcome.    CLINICAL DECISION MAKING: Stable/uncomplicated  EVALUATION COMPLEXITY: Low   PLAN: PT FREQUENCY:  no PT warranted; if additional visits needed will re-cert upon seeing ENT  PLAN FOR NEXT SESSION: No PT warranted   Marlowe Kays, PT, DPT 09/15/2021, 8:21 AM

## 2021-09-22 DIAGNOSIS — R42 Dizziness and giddiness: Secondary | ICD-10-CM | POA: Diagnosis not present

## 2021-09-22 MED ORDER — DEXAMETHASONE SODIUM PHOSPHATE 10 MG/ML IJ SOLN
10.0000 mg | Freq: Once | INTRAMUSCULAR | Status: AC
  Administered 2021-09-22: 10 mg via INTRAVENOUS

## 2021-09-22 MED ORDER — KETOROLAC TROMETHAMINE 60 MG/2ML IM SOLN
60.0000 mg | Freq: Once | INTRAMUSCULAR | Status: AC
  Administered 2021-09-22: 60 mg via INTRAMUSCULAR

## 2021-09-22 NOTE — Addendum Note (Signed)
Addended by: Hendricks Milo on: 09/22/2021 11:37 AM   Modules accepted: Orders

## 2021-09-28 ENCOUNTER — Encounter (HOSPITAL_BASED_OUTPATIENT_CLINIC_OR_DEPARTMENT_OTHER): Payer: Self-pay | Admitting: Nurse Practitioner

## 2021-09-28 DIAGNOSIS — G43019 Migraine without aura, intractable, without status migrainosus: Secondary | ICD-10-CM

## 2021-09-29 ENCOUNTER — Other Ambulatory Visit (HOSPITAL_COMMUNITY): Payer: Self-pay

## 2021-09-30 ENCOUNTER — Other Ambulatory Visit (HOSPITAL_COMMUNITY): Payer: Self-pay

## 2021-09-30 MED ORDER — RIZATRIPTAN BENZOATE 10 MG PO TABS
10.0000 mg | ORAL_TABLET | ORAL | 6 refills | Status: DC | PRN
Start: 2021-09-30 — End: 2022-10-13
  Filled 2021-09-30: qty 18, 30d supply, fill #0
  Filled 2022-02-20: qty 18, 30d supply, fill #1
  Filled 2022-05-20: qty 18, 30d supply, fill #2

## 2021-10-01 ENCOUNTER — Other Ambulatory Visit (HOSPITAL_COMMUNITY): Payer: Self-pay

## 2021-10-05 ENCOUNTER — Other Ambulatory Visit (HOSPITAL_COMMUNITY): Payer: Self-pay

## 2021-10-12 DIAGNOSIS — F411 Generalized anxiety disorder: Secondary | ICD-10-CM | POA: Diagnosis not present

## 2021-10-12 DIAGNOSIS — F32A Depression, unspecified: Secondary | ICD-10-CM | POA: Diagnosis not present

## 2021-11-04 ENCOUNTER — Other Ambulatory Visit (HOSPITAL_COMMUNITY): Payer: Self-pay

## 2021-11-12 ENCOUNTER — Encounter (HOSPITAL_BASED_OUTPATIENT_CLINIC_OR_DEPARTMENT_OTHER): Payer: Self-pay | Admitting: Nurse Practitioner

## 2021-11-16 ENCOUNTER — Other Ambulatory Visit (HOSPITAL_BASED_OUTPATIENT_CLINIC_OR_DEPARTMENT_OTHER): Payer: Self-pay | Admitting: Nurse Practitioner

## 2021-11-16 DIAGNOSIS — U071 COVID-19: Secondary | ICD-10-CM

## 2021-11-16 MED ORDER — NIRMATRELVIR/RITONAVIR (PAXLOVID)TABLET: 3.0000 | ORAL_TABLET | Freq: Two times a day (BID) | ORAL | 0 refills | Status: AC

## 2021-12-14 ENCOUNTER — Encounter (HOSPITAL_BASED_OUTPATIENT_CLINIC_OR_DEPARTMENT_OTHER): Payer: Self-pay | Admitting: Nurse Practitioner

## 2021-12-14 ENCOUNTER — Other Ambulatory Visit (HOSPITAL_BASED_OUTPATIENT_CLINIC_OR_DEPARTMENT_OTHER): Payer: Self-pay

## 2021-12-14 DIAGNOSIS — G43019 Migraine without aura, intractable, without status migrainosus: Secondary | ICD-10-CM

## 2021-12-22 ENCOUNTER — Other Ambulatory Visit (HOSPITAL_COMMUNITY): Payer: Self-pay

## 2022-01-06 ENCOUNTER — Other Ambulatory Visit: Payer: Self-pay

## 2022-01-06 ENCOUNTER — Ambulatory Visit (HOSPITAL_BASED_OUTPATIENT_CLINIC_OR_DEPARTMENT_OTHER): Payer: BC Managed Care – PPO | Attending: Nurse Practitioner | Admitting: Physical Therapy

## 2022-01-06 ENCOUNTER — Encounter (HOSPITAL_BASED_OUTPATIENT_CLINIC_OR_DEPARTMENT_OTHER): Payer: Self-pay | Admitting: Physical Therapy

## 2022-01-06 DIAGNOSIS — R252 Cramp and spasm: Secondary | ICD-10-CM | POA: Diagnosis not present

## 2022-01-06 DIAGNOSIS — R293 Abnormal posture: Secondary | ICD-10-CM | POA: Diagnosis not present

## 2022-01-06 DIAGNOSIS — G43019 Migraine without aura, intractable, without status migrainosus: Secondary | ICD-10-CM | POA: Diagnosis not present

## 2022-01-06 DIAGNOSIS — M542 Cervicalgia: Secondary | ICD-10-CM | POA: Insufficient documentation

## 2022-01-06 NOTE — Therapy (Signed)
OUTPATIENT PHYSICAL THERAPY CERVICAL EVALUATION   Patient Name: Monique Ellison MRN: [id number] DOB:1985/07/02, 36 y.o., female Today's Date: 01/06/2022  END OF SESSION:   Past Medical History:  Diagnosis Date   Family history of heart disease 05/29/2015   Father had MI in 2's   Former smoker 05/29/2015   Quit 2008   GERD (gastroesophageal reflux disease) 05/29/2015   Seasonal allergies    Past Surgical History:  Procedure Laterality Date   WISDOM TOOTH EXTRACTION     Patient Active Problem List   Diagnosis Date Noted   Dizziness 09/13/2021   Abnormal cervical Papanicolaou smear 02/07/2020   Family history of heart disease 08/16/2017   Anxiety 02/23/2017   Panic attack 02/20/2017   Tension headache 08/22/2016   GERD (gastroesophageal reflux disease) 05/29/2015   Former smoker 05/29/2015    PCP: Orma Render, NP   REFERRING PROVIDER: Orma Render, NP   REFERRING DIAG: Intractable migraine without aura and without status migrainosus [G43.019]   THERAPY DIAG:  Abnormal posture  Cervicalgia  Cramp and spasm  Rationale for Evaluation and Treatment: Rehabilitation  ONSET DATE: Over a year ago.   SUBJECTIVE:                                                                                                                                                                                                         SUBJECTIVE STATEMENT: Pt states that she has been having chronic headaches for over a year. She also reports having a constant knot in her R UT. Pt has been seeking out private PT and has found relief from dry needling.   PERTINENT HISTORY:  None  PAIN:  Are you having pain? Yes: NPRS scale: 2-3/10 Pain location: R UT and cervical headaches.  Pain description: Pin point pain in her R UT.  Aggravating factors: Sleeping wrong.  Relieving factors: Dry needling, tennis ball on knot, heat.   PRECAUTIONS: None  WEIGHT BEARING RESTRICTIONS: No  FALLS:  Has  patient fallen in last 6 months? No  LIVING ENVIRONMENT: Lives with: lives with their spouse Lives in: House/apartment Stairs: Yes: External: 3 steps; none Has following equipment at home: None  OCCUPATION: PRN nurse.   PLOF: Independent  PATIENT GOALS: Pt would like to minimize pain and cervical headaches.   NEXT MD VISIT:   OBJECTIVE:   DIAGNOSTIC FINDINGS:  None  PATIENT SURVEYS:  FOTO 65.51%, 69% in 10 visits  COGNITION: Overall cognitive status: Within functional limits for tasks assessed  SENSATION: WFL  POSTURE: rounded shoulders and forward head  PALPATION: R UT   CERVICAL ROM:   Active ROM A/PROM (deg) eval  Flexion WFL  Extension WFL  Right lateral flexion WFL  Left lateral flexion WFL  Right rotation WFL  Left rotation WFL   (Blank rows = not tested)  UPPER EXTREMITY ROM:  Active ROM Right eval Left eval  Shoulder flexion [facility]  Shoulder abduction    Shoulder internal rotation [facility]  Shoulder external rotation WFL WFL   (Blank rows = not tested)  UPPER EXTREMITY MMT:  MMT Right eval Left eval  Shoulder flexion 4+/5 4+/5  Shoulder internal rotation 4+/5 4+/5  Shoulder external rotation 4+/5 4+/5   (Blank rows = not tested)   TODAY'S TREATMENT:                                                                                                                              DATE: Creating, reviewing, and completing below HEP Trigger Point Dry-Needling  Treatment instructions: Expect mild to moderate muscle soreness. S/S of pneumothorax if dry needled over a lung field, and to seek immediate medical attention should they occur. Patient verbalized understanding of these instructions and education.  Patient Consent Given: Yes Education handout provided: No Muscles treated: R UT, LS Electrical stimulation performed: No Parameters: N/A Treatment response/outcome: Multiple twitch responses.       PATIENT EDUCATION:  Education  details: Educated pt on anatomy and physiology of current symptoms, FOTO, diagnosis, prognosis, HEP,  and POC. Person educated: Patient Education method: Customer service manager Education comprehension: verbalized understanding and returned demonstration  HOME EXERCISE PROGRAM: Access Code: [email or web address] URL: [email or web address] Date: 01/06/2022 Prepared by: Rudi Heap  Exercises - Seated Upper Trapezius Stretch  - 2 x daily - 7 x weekly - 2 sets - 30 hold - Seated Scapular Retraction  - 2 x daily - 7 x weekly - 2 sets - 10 reps - 30 hold - Seated Levator Scapulae Stretch  - 2 x daily - 7 x weekly - 2 sets - 30 hold  ASSESSMENT:  CLINICAL IMPRESSION: Patient referred to PT for  cervical headaches, and R shoulder pain. She demonstrates full functional ROM, decreased global strength. Patient will benefit from skilled PT to address below impairments, limitations and improve overall function.  OBJECTIVE IMPAIRMENTS: decreased activity tolerance, decreased shoulder mobility, decreased strength, impaired flexibility, impaired UE use, postural dysfunction, and pain.  ACTIVITY LIMITATIONS: reaching, lifting, carry,  cleaning, driving, and or occupation  PERSONAL FACTORS: also affecting patient's functional outcome.  REHAB POTENTIAL: Good  CLINICAL DECISION MAKING: Stable/uncomplicated  EVALUATION COMPLEXITY: Low    GOALS: Short term PT Goals Target date: 01/27/2022 Pt will be I and compliant with HEP. Baseline:  Goal status: New  Long term PT goals Target date: 02/17/2022 Pt will report no headaches for a week duration  Baseline: Goal status: New Pt will improve  Rt shoulder strength to at least 5/5 MMT to improve functional strength Baseline: Goal status: New  Pt will improve FOTO to at least 69% functional to show improved function Baseline: Goal status: New Pt will reduce pain to overall less than <1/10 with usual activity and work  activity. Baseline: Goal status: New       5. Pt will demonstrate good posture and ergonomic posture when working.  Baseline:  Goal status:    PLAN: PT FREQUENCY: 1x per week   PT DURATION: 6 weeks  PLANNED INTERVENTIONS (unless contraindicated): aquatic PT, Canalith repositioning, cryotherapy, Electrical stimulation, Iontophoresis with 4 mg/ml dexamethasome, Moist heat, traction, Ultrasound, gait training, Therapeutic exercise, balance training, neuromuscular re-education, patient/family education, prosthetic training, manual techniques, passive ROM, dry needling, taping, vasopnuematic device, vestibular, spinal manipulations, joint manipulations  PLAN FOR NEXT SESSION: review HEP, DN, Strengthening parascapular muscles. Ergonomics while working.     Champ Mungo, PT 01/06/2022, 2:27 PM

## 2022-01-18 ENCOUNTER — Ambulatory Visit (HOSPITAL_BASED_OUTPATIENT_CLINIC_OR_DEPARTMENT_OTHER): Payer: BC Managed Care – PPO | Admitting: Physical Therapy

## 2022-01-18 DIAGNOSIS — R252 Cramp and spasm: Secondary | ICD-10-CM

## 2022-01-18 DIAGNOSIS — R293 Abnormal posture: Secondary | ICD-10-CM

## 2022-01-18 DIAGNOSIS — G43019 Migraine without aura, intractable, without status migrainosus: Secondary | ICD-10-CM | POA: Diagnosis not present

## 2022-01-18 DIAGNOSIS — M542 Cervicalgia: Secondary | ICD-10-CM

## 2022-01-18 NOTE — Therapy (Signed)
OUTPATIENT PHYSICAL THERAPY TREATMENT NOTE   Patient Name: Monique Ellison MRN: [id number] DOB:[birth year], 36 y.o., female Today's Date: 01/18/2022  PCP:  Tollie Eth, NP  REFERRING PROVIDER:  Tollie Eth, NP   END OF SESSION:   PT End of Session - 01/18/22 1222     Number of Visits 6    Date for PT Re-Evaluation 02/24/21    Authorization Type BCBS    PT Start Time 1146    PT Stop Time 1225    PT Time Calculation (min) 39 min    Activity Tolerance Patient tolerated treatment well    Behavior During Therapy [facility] for tasks assessed/performed             Past Medical History:  Diagnosis Date   Family history of heart disease 05/29/2015   Father had MI in 11's   Former smoker 05/29/2015   Quit 2008   GERD (gastroesophageal reflux disease) 05/29/2015   Seasonal allergies    Past Surgical History:  Procedure Laterality Date   WISDOM TOOTH EXTRACTION     Patient Active Problem List   Diagnosis Date Noted   Dizziness 09/13/2021   Abnormal cervical Papanicolaou smear 02/07/2020   Family history of heart disease 08/16/2017   Anxiety 02/23/2017   Panic attack 02/20/2017   Tension headache 08/22/2016   GERD (gastroesophageal reflux disease) 05/29/2015   Former smoker 05/29/2015    REFERRING DIAG: Intractable migraine without aura and without status migrainosus [G43.019]    THERAPY DIAG:  Abnormal posture  Cervicalgia  Cramp and spasm  Rationale for Evaluation and Treatment Rehabilitation  PERTINENT HISTORY: None  PRECAUTIONS: None  SUBJECTIVE:                                                                                                                                                                                      SUBJECTIVE STATEMENT: Pt states that she continues to have pain in her R shoulder > L.  Headaches have been less frequent and less intense.   Eval: Pt states that she has been having chronic headaches for over a year. She also reports  having a constant knot in her R UT. Pt has been seeking out private PT and has found relief from dry needling.     PAIN:  Are you having pain? Yes: NPRS scale: 2-3/10 Pain location: R UT and cervical headaches.  Pain description: Pin point pain in her R UT.  Aggravating factors: Sleeping wrong.  Relieving factors: Dry needling, tennis ball on knot, heat.  OBJECTIVE:    DIAGNOSTIC FINDINGS:  None   PATIENT SURVEYS:  FOTO 65.51%, 69% in 10 visits  COGNITION: Overall cognitive status: Within functional limits for tasks assessed   SENSATION: WFL   POSTURE: rounded shoulders and forward head   PALPATION: R UT       CERVICAL ROM:    Active ROM A/PROM (deg) eval  Flexion WFL  Extension WFL  Right lateral flexion WFL  Left lateral flexion WFL  Right rotation WFL  Left rotation WFL   (Blank rows = not tested)   UPPER EXTREMITY ROM:   Active ROM Right eval Left eval  Shoulder flexion [facility]  Shoulder abduction      Shoulder internal rotation [facility]  Shoulder external rotation [facility] WFL   (Blank rows = not tested)   UPPER EXTREMITY MMT:   MMT Right eval Left eval  Shoulder flexion 4+/5 4+/5  Shoulder internal rotation 4+/5 4+/5  Shoulder external rotation 4+/5 4+/5   (Blank rows = not tested)     TODAY'S TREATMENT: 01/18/2022: UBE: 2 min fwd/ 2 min bkwd Prone scaption 5 sec hold.  UT stretch, LS stretch, rhomboid stretch  Chin tucks Suboccipital release STM to R rhomboid following dry needling.     Trigger Point Dry-Needling  Treatment instructions: Expect mild to moderate muscle soreness. S/S of pneumothorax if dry needled over a lung field, and to seek immediate medical attention should they occur. Patient verbalized understanding of these instructions and education.   Patient Consent Given: Yes Education handout provided: No Muscles treated: R UT, LS, Rhomboid major Electrical stimulation performed: No Parameters: N/A Treatment  response/outcome: Multiple twitch responses.                                                                                                                              DATE: Creating, reviewing, and completing below HEP Trigger Point Dry-Needling  Treatment instructions: Expect mild to moderate muscle soreness. S/S of pneumothorax if dry needled over a lung field, and to seek immediate medical attention should they occur. Patient verbalized understanding of these instructions and education.   Patient Consent Given: Yes Education handout provided: No Muscles treated: R UT, LS Electrical stimulation performed: No Parameters: N/A Treatment response/outcome: Multiple twitch responses.          PATIENT EDUCATION:  Education details: Educated pt on anatomy and physiology of current symptoms, FOTO, diagnosis, prognosis, HEP,  and POC. Person educated: Patient Education method: Medical illustrator Education comprehension: verbalized understanding and returned demonstration   HOME EXERCISE PROGRAM: Access Code: [email or web address] URL: [email or web address] Date: 01/06/2022 Prepared by: Royal Hawthorn   Exercises - Seated Upper Trapezius Stretch  - 2 x daily - 7 x weekly - 2 sets - 30 hold - Seated Scapular Retraction  - 2 x daily - 7 x weekly - 2 sets - 10 reps - 30 hold - Seated Levator Scapulae Stretch  - 2 x daily - 7 x weekly - 2 sets - 30 hold   ASSESSMENT:   CLINICAL IMPRESSION: Patient tolerated dry needling well today  with a reported ache in R rhomboid. Pt denies any other adverse effects. She states that, "we hit the spot". Followed up with STM to R rhomboid to help reduce trigger point. Educated pt on stretches to help minimize tension in shoulder and rhomboids. She reports improvements in pain upon completion of session today.   OBJECTIVE IMPAIRMENTS: decreased activity tolerance, decreased shoulder mobility, decreased strength, impaired flexibility, impaired UE  use, postural dysfunction, and pain.   ACTIVITY LIMITATIONS: reaching, lifting, carry,  cleaning, driving, and or occupation   PERSONAL FACTORS: also affecting patient's functional outcome.   REHAB POTENTIAL: Good   CLINICAL DECISION MAKING: Stable/uncomplicated   EVALUATION COMPLEXITY: Low       GOALS: Short term PT Goals Target date: 01/27/2022 Pt will be I and compliant with HEP. Baseline:  Goal status: New   Long term PT goals Target date: 02/17/2022 Pt will report no headaches for a week duration  Baseline: Goal status: New Pt will improve  Rt shoulder strength to at least 5/5 MMT to improve functional strength Baseline: Goal status: New Pt will improve FOTO to at least 69% functional to show improved function Baseline: Goal status: New Pt will reduce pain to overall less than <1/10 with usual activity and work activity. Baseline: Goal status: New       5. Pt will demonstrate good posture and ergonomic posture when working.  Baseline:  Goal status:     PLAN: PT FREQUENCY: 1x per week    PT DURATION: 6 weeks   PLANNED INTERVENTIONS (unless contraindicated): aquatic PT, Canalith repositioning, cryotherapy, Electrical stimulation, Iontophoresis with 4 mg/ml dexamethasome, Moist heat, traction, Ultrasound, gait training, Therapeutic exercise, balance training, neuromuscular re-education, patient/family education, prosthetic training, manual techniques, passive ROM, dry needling, taping, vasopnuematic device, vestibular, spinal manipulations, joint manipulations   PLAN FOR NEXT SESSION: review HEP, DN, Strengthening parascapular muscles. Ergonomics while working. Dry needling suboccipitals.    Champ Mungo, PT 01/18/2022, 12:27 PM

## 2022-01-21 ENCOUNTER — Other Ambulatory Visit (HOSPITAL_COMMUNITY): Payer: Self-pay

## 2022-01-26 ENCOUNTER — Ambulatory Visit (HOSPITAL_BASED_OUTPATIENT_CLINIC_OR_DEPARTMENT_OTHER): Payer: BC Managed Care – PPO | Admitting: Physical Therapy

## 2022-01-26 ENCOUNTER — Encounter (HOSPITAL_BASED_OUTPATIENT_CLINIC_OR_DEPARTMENT_OTHER): Payer: Self-pay

## 2022-02-03 ENCOUNTER — Encounter (HOSPITAL_BASED_OUTPATIENT_CLINIC_OR_DEPARTMENT_OTHER): Payer: Self-pay | Admitting: Physical Therapy

## 2022-02-03 ENCOUNTER — Ambulatory Visit (HOSPITAL_BASED_OUTPATIENT_CLINIC_OR_DEPARTMENT_OTHER): Payer: BC Managed Care – PPO | Attending: Nurse Practitioner | Admitting: Physical Therapy

## 2022-02-03 DIAGNOSIS — R293 Abnormal posture: Secondary | ICD-10-CM | POA: Insufficient documentation

## 2022-02-03 DIAGNOSIS — M542 Cervicalgia: Secondary | ICD-10-CM | POA: Diagnosis not present

## 2022-02-03 DIAGNOSIS — R252 Cramp and spasm: Secondary | ICD-10-CM | POA: Insufficient documentation

## 2022-02-03 NOTE — Therapy (Signed)
OUTPATIENT PHYSICAL THERAPY TREATMENT NOTE   Patient Name: Monique Ellison MRN: [id number] DOB:09-Jan-1986, 36 y.o., female Today's Date: 02/03/2022  PCP:  Monique Eth, NP  REFERRING PROVIDER:  Tollie Eth, NP   END OF SESSION:   PT End of Session - 02/03/22 1300     Visit Number 3    Number of Visits 6    Date for PT Re-Evaluation 02/24/21    Authorization Type BCBS    PT Start Time 1300    PT Stop Time 1345    PT Time Calculation (min) 45 min    Activity Tolerance Patient tolerated treatment well    Behavior During Therapy [facility] for tasks assessed/performed              Past Medical History:  Diagnosis Date   Family history of heart disease 05/29/2015   Father had MI in 76's   Former smoker 05/29/2015   Quit 2008   GERD (gastroesophageal reflux disease) 05/29/2015   Seasonal allergies    Past Surgical History:  Procedure Laterality Date   WISDOM TOOTH EXTRACTION     Patient Active Problem List   Diagnosis Date Noted   Dizziness 09/13/2021   Abnormal cervical Papanicolaou smear 02/07/2020   Family history of heart disease 08/16/2017   Anxiety 02/23/2017   Panic attack 02/20/2017   Tension headache 08/22/2016   GERD (gastroesophageal reflux disease) 05/29/2015   Former smoker 05/29/2015    REFERRING DIAG: Intractable migraine without aura and without status migrainosus [G43.019]    THERAPY DIAG:  Abnormal posture  Cervicalgia  Cramp and spasm  Rationale for Evaluation and Treatment Rehabilitation  PERTINENT HISTORY: None  PRECAUTIONS: None  SUBJECTIVE:                                                                                                                                                                                      SUBJECTIVE STATEMENT:   I had a migraine on Friday.   PAIN:  Are you having pain? Yes: NPRS scale: 2-3/10 Pain location: R UT and cervical headaches.  Pain description: Pin point pain in her R UT.  Aggravating  factors: Sleeping wrong.  Relieving factors: Dry needling, tennis ball on knot, heat.  OBJECTIVE:    DIAGNOSTIC FINDINGS:  None   PATIENT SURVEYS:  FOTO 65.51%, 69% in 10 visits     POSTURE: rounded shoulders and forward head   PALPATION: R UT       CERVICAL ROM:    Active ROM A/PROM (deg) eval  Flexion WFL  Extension WFL  Right lateral flexion WFL  Left lateral flexion WFL  Right rotation [facility]  Left  rotation WFL   (Blank rows = not tested)   UPPER EXTREMITY ROM:   Active ROM Right eval Left eval  Shoulder flexion [facility]  Shoulder abduction      Shoulder internal rotation [facility]  Shoulder external rotation WFL WFL   (Blank rows = not tested)   UPPER EXTREMITY MMT:   MMT Right eval Left eval  Shoulder flexion 4+/5 4+/5  Shoulder internal rotation 4+/5 4+/5  Shoulder external rotation 4+/5 4+/5   (Blank rows = not tested)     TODAY'S TREATMENT:  Treatment                            02/03/22:  Trigger Point Dry Needling, Manual Therapy Treatment:  Initial or subsequent education regarding Trigger Point Dry Needling: Subsequent Did patient give consent to treatment with Trigger Point Dry Needling: Yes TPDN with skilled palpation and monitoring followed by STM to the following muscles: bil upper trap, Rt rhomboids Bil first rib inf mobs Gross rib mobs with focus on right side  Supine on half foam roll: belly breathing- hands on rib cage, in flexion 90 & 160; W with breathing, snow angels with elbows flexed; core: marching, bridge rolls with ball bw knees Seated postural alignment/core contraction      PATIENT EDUCATION:  Education details: Educated pt on anatomy and physiology of current symptoms, FOTO, diagnosis, prognosis, HEP,  and POC. Person educated: Patient Education method: Medical illustrator Education comprehension: verbalized understanding and returned demonstration   HOME EXERCISE PROGRAM: Access Code: [email or web address] URL:  [email or web address]    ASSESSMENT:   CLINICAL IMPRESSION:   Significant twitch noted and referral patterns to head reduced. Will require further core stability training.    OBJECTIVE IMPAIRMENTS: decreased activity tolerance, decreased shoulder mobility, decreased strength, impaired flexibility, impaired UE use, postural dysfunction, and pain.   ACTIVITY LIMITATIONS: reaching, lifting, carry,  cleaning, driving, and or occupation   PERSONAL FACTORS: also affecting patient's functional outcome.        GOALS: Short term PT Goals Target date: 01/27/2022 Pt will be I and compliant with HEP. Baseline:  Goal status: achieved   Long term PT goals Target date: 02/17/2022 Pt will report no headaches for a week duration  Baseline: Goal status: New Pt will improve  Rt shoulder strength to at least 5/5 MMT to improve functional strength Baseline: Goal status: New Pt will improve FOTO to at least 69% functional to show improved function Baseline: Goal status: New Pt will reduce pain to overall less than <1/10 with usual activity and work activity. Baseline: Goal status: New       5. Pt will demonstrate good posture and ergonomic posture when working.  Baseline:  Goal status:     PLAN: PT FREQUENCY: 1x per week    PT DURATION: 6 weeks   PLANNED INTERVENTIONS (unless contraindicated): aquatic PT, Canalith repositioning, cryotherapy, Electrical stimulation, Iontophoresis with 4 mg/ml dexamethasome, Moist heat, traction, Ultrasound, gait training, Therapeutic exercise, balance training, neuromuscular re-education, patient/family education, prosthetic training, manual techniques, passive ROM, dry needling, taping, vasopnuematic device, vestibular, spinal manipulations, joint manipulations   PLAN FOR NEXT SESSION: review HEP, DN, Strengthening parascapular muscles. Ergonomics while working. Dry needling suboccipitals.    Monique Ellison. Lew Prout PT, DPT 02/03/22 1:46  PM

## 2022-02-08 ENCOUNTER — Encounter (HOSPITAL_BASED_OUTPATIENT_CLINIC_OR_DEPARTMENT_OTHER): Payer: Self-pay | Admitting: Physical Therapy

## 2022-02-08 ENCOUNTER — Ambulatory Visit (HOSPITAL_BASED_OUTPATIENT_CLINIC_OR_DEPARTMENT_OTHER): Payer: BC Managed Care – PPO | Admitting: Physical Therapy

## 2022-02-08 DIAGNOSIS — R293 Abnormal posture: Secondary | ICD-10-CM

## 2022-02-08 DIAGNOSIS — M542 Cervicalgia: Secondary | ICD-10-CM

## 2022-02-08 DIAGNOSIS — R252 Cramp and spasm: Secondary | ICD-10-CM

## 2022-02-08 NOTE — Therapy (Signed)
OUTPATIENT PHYSICAL THERAPY TREATMENT NOTE   Patient Name: Monique Ellison MRN: [id number] DOB:[birth year], 36 y.o., female Today's Date: 02/08/2022  PCP:  Tollie Eth, NP  REFERRING PROVIDER:  Tollie Eth, NP   END OF SESSION:   PT End of Session - 02/08/22 1312     Visit Number 4    Number of Visits 6    Date for PT Re-Evaluation 02/24/21    Authorization Type BCBS    PT Start Time 1312    PT Stop Time 1355    PT Time Calculation (min) 43 min    Activity Tolerance Patient tolerated treatment well    Behavior During Therapy [facility] for tasks assessed/performed              Past Medical History:  Diagnosis Date   Family history of heart disease 05/29/2015   Father had MI in 46's   Former smoker 05/29/2015   Quit 2008   GERD (gastroesophageal reflux disease) 05/29/2015   Seasonal allergies    Past Surgical History:  Procedure Laterality Date   WISDOM TOOTH EXTRACTION     Patient Active Problem List   Diagnosis Date Noted   Dizziness 09/13/2021   Abnormal cervical Papanicolaou smear 02/07/2020   Family history of heart disease 08/16/2017   Anxiety 02/23/2017   Panic attack 02/20/2017   Tension headache 08/22/2016   GERD (gastroesophageal reflux disease) 05/29/2015   Former smoker 05/29/2015    REFERRING DIAG: Intractable migraine without aura and without status migrainosus [G43.019]    THERAPY DIAG:  Abnormal posture  Cervicalgia  Cramp and spasm  Rationale for Evaluation and Treatment Rehabilitation  PERTINENT HISTORY: None  PRECAUTIONS: None  SUBJECTIVE:                                                                                                                                                                                      SUBJECTIVE STATEMENT:   I had some HA that were less intense than usual for about the first 48 hr. Just a little sore today.    PAIN:  Are you having pain? Yes: NPRS scale: 2-3/10 Pain location: R UT and cervical  headaches.  Pain description: Pin point pain in her R UT.  Aggravating factors: Sleeping wrong.  Relieving factors: Dry needling, tennis ball on knot, heat.  OBJECTIVE:    DIAGNOSTIC FINDINGS:  None   PATIENT SURVEYS:  FOTO 65.51%, 69% in 10 visits     POSTURE: rounded shoulders and forward head   PALPATION: R UT       CERVICAL ROM:    Active ROM A/PROM (deg) eval  Flexion [facility]  Extension [facility]  Right lateral flexion WFL  Left lateral flexion WFL  Right rotation WFL  Left rotation WFL   (Blank rows = not tested)   UPPER EXTREMITY ROM:   Active ROM Right eval Left eval  Shoulder flexion [facility]  Shoulder abduction      Shoulder internal rotation [facility]  Shoulder external rotation WFL WFL   (Blank rows = not tested)   UPPER EXTREMITY MMT:   MMT Right eval Left eval  Shoulder flexion 4+/5 4+/5  Shoulder internal rotation 4+/5 4+/5  Shoulder external rotation 4+/5 4+/5   (Blank rows = not tested)     TODAY'S TREATMENT:  Treatment                            02/08/22:  Manual prone rib and thoracic mobs, Rt scapular mobilization; STM thoracic paraspinals, infra/supraspinatus, upper trap Supine suboccipital release; AP nodding mobs for C0 on 1 with sidebend bias Hooklying chin tuck, added head lift Upper trap stretch with sheet for first rib mob Review of seated postural alignment   Treatment                            02/03/22:  Trigger Point Dry Needling, Manual Therapy Treatment:  Initial or subsequent education regarding Trigger Point Dry Needling: Subsequent Did patient give consent to treatment with Trigger Point Dry Needling: Yes TPDN with skilled palpation and monitoring followed by STM to the following muscles: bil upper trap, Rt rhomboids Bil first rib inf mobs Gross rib mobs with focus on right side  Supine on half foam roll: belly breathing- hands on rib cage, in flexion 90 & 160; W with breathing, snow angels with elbows flexed; core:  marching, bridge rolls with ball bw knees Seated postural alignment/core contraction      PATIENT EDUCATION:  Education details: Educated pt on anatomy and physiology of current symptoms, FOTO, diagnosis, prognosis, HEP,  and POC. Person educated: Patient Education method: Medical illustrator Education comprehension: verbalized understanding and returned demonstration   HOME EXERCISE PROGRAM: Access Code: [email or web address] URL: [email or web address]    ASSESSMENT:   CLINICAL IMPRESSION:   Decrease in headache intensity indicates progress toward reducing myofascial contribution to migraines.  Is working on creating a proper ergonomic set up at home and will continue to progress towards anterior cervical and core stability.   OBJECTIVE IMPAIRMENTS: decreased activity tolerance, decreased shoulder mobility, decreased strength, impaired flexibility, impaired UE use, postural dysfunction, and pain.   ACTIVITY LIMITATIONS: reaching, lifting, carry,  cleaning, driving, and or occupation   PERSONAL FACTORS: also affecting patient's functional outcome.        GOALS: Short term PT Goals Target date: 01/27/2022 Pt will be I and compliant with HEP. Baseline:  Goal status: achieved   Long term PT goals Target date: 02/17/2022 Pt will report no headaches for a week duration  Baseline: Goal status: New Pt will improve  Rt shoulder strength to at least 5/5 MMT to improve functional strength Baseline: Goal status: New Pt will improve FOTO to at least 69% functional to show improved function Baseline: Goal status: New Pt will reduce pain to overall less than <1/10 with usual activity and work activity. Baseline: Goal status: New       5. Pt will demonstrate good posture and ergonomic posture when working.  Baseline:  Goal status:     PLAN: PT FREQUENCY: 1x per week  PT DURATION: 6 weeks   PLANNED INTERVENTIONS (unless contraindicated): aquatic PT, Canalith  repositioning, cryotherapy, Electrical stimulation, Iontophoresis with 4 mg/ml dexamethasome, Moist heat, traction, Ultrasound, gait training, Therapeutic exercise, balance training, neuromuscular re-education, patient/family education, prosthetic training, manual techniques, passive ROM, dry needling, taping, vasopnuematic device, vestibular, spinal manipulations, joint manipulations   PLAN FOR NEXT SESSION: review HEP, DN, Strengthening parascapular muscles. Ergonomics while working. Dry needling suboccipitals.    Elyas Villamor C. Justus Droke PT, DPT 02/08/22 2:06 PM

## 2022-02-17 ENCOUNTER — Ambulatory Visit (HOSPITAL_BASED_OUTPATIENT_CLINIC_OR_DEPARTMENT_OTHER): Payer: BC Managed Care – PPO | Admitting: Physical Therapy

## 2022-02-17 ENCOUNTER — Encounter (HOSPITAL_BASED_OUTPATIENT_CLINIC_OR_DEPARTMENT_OTHER): Payer: Self-pay | Admitting: Physical Therapy

## 2022-02-17 DIAGNOSIS — R252 Cramp and spasm: Secondary | ICD-10-CM | POA: Diagnosis not present

## 2022-02-17 DIAGNOSIS — R293 Abnormal posture: Secondary | ICD-10-CM

## 2022-02-17 DIAGNOSIS — M542 Cervicalgia: Secondary | ICD-10-CM

## 2022-02-17 NOTE — Therapy (Signed)
OUTPATIENT PHYSICAL THERAPY TREATMENT NOTE   Patient Name: Monique Ellison MRN: [id number] DOB:Sep 14, 1985, 36 y.o., female Today's Date: 02/17/2022  PCP:  Tollie Eth, NP  REFERRING PROVIDER:  Tollie Eth, NP   END OF SESSION:   PT End of Session - 02/17/22 1333     Visit Number 5    Number of Visits 6    Date for PT Re-Evaluation 02/24/21    Authorization Type BCBS    PT Start Time 1300    PT Stop Time 1342    PT Time Calculation (min) 42 min    Activity Tolerance Patient tolerated treatment well    Behavior During Therapy [facility] for tasks assessed/performed              Past Medical History:  Diagnosis Date   Family history of heart disease 05/29/2015   Father had MI in 58's   Former smoker 05/29/2015   Quit 2008   GERD (gastroesophageal reflux disease) 05/29/2015   Seasonal allergies    Past Surgical History:  Procedure Laterality Date   WISDOM TOOTH EXTRACTION     Patient Active Problem List   Diagnosis Date Noted   Dizziness 09/13/2021   Abnormal cervical Papanicolaou smear 02/07/2020   Family history of heart disease 08/16/2017   Anxiety 02/23/2017   Panic attack 02/20/2017   Tension headache 08/22/2016   GERD (gastroesophageal reflux disease) 05/29/2015   Former smoker 05/29/2015    REFERRING DIAG: Intractable migraine without aura and without status migrainosus [G43.019]    THERAPY DIAG:  Abnormal posture  Cervicalgia  Cramp and spasm  Rationale for Evaluation and Treatment Rehabilitation  PERTINENT HISTORY: None  PRECAUTIONS: None  SUBJECTIVE:                                                                                                                                                                                      SUBJECTIVE STATEMENT:   I had some HA that were less intense than usual for about the first 48 hr. Just a little sore today.    PAIN:  Are you having pain? Yes: NPRS scale: 2-3/10 Pain location: R UT and cervical  headaches.  Pain description: Pin point pain in her R UT.  Aggravating factors: Sleeping wrong.  Relieving factors: Dry needling, tennis ball on knot, heat.  OBJECTIVE:    DIAGNOSTIC FINDINGS:  None   PATIENT SURVEYS:  FOTO 65.51%, 69% in 10 visits     POSTURE: rounded shoulders and forward head   PALPATION: R UT       CERVICAL ROM:    Active ROM A/PROM (deg) eval  Flexion [facility]  Extension [facility]  Right lateral flexion WFL  Left lateral flexion WFL  Right rotation WFL  Left rotation WFL   (Blank rows = not tested)   UPPER EXTREMITY ROM:   Active ROM Right eval Left eval  Shoulder flexion [facility]  Shoulder abduction      Shoulder internal rotation [facility]  Shoulder external rotation [facility] WFL   (Blank rows = not tested)   UPPER EXTREMITY MMT:   MMT Right eval Left eval  Shoulder flexion 4+/5 4+/5  Shoulder internal rotation 4+/5 4+/5  Shoulder external rotation 4+/5 4+/5   (Blank rows = not tested)     TODAY'S TREATMENT: 12/28 Trigger Point Dry-Needling  Treatment instructions: Expect mild to moderate muscle soreness. S/S of pneumothorax if dry needled over a lung field, and to seek immediate medical attention should they occur. Patient verbalized understanding of these instructions and education.  Patient Consent Given: No Education handout provided: No Muscles treated: upper trap 1x bilateral sub-occipitals 1x bilateral cervical paraspinal C7 1x bilateral  Electrical stimulation performed: No Parameters: N/A Treatment response/outcome:  great twitch to all   Reviewed use of trigger point for specific headaches using the thera-cane She has a thera-cane at home   Row 2x15 red  Shoulder extension 2x15 red        Treatment                            02/08/22:  Manual prone rib and thoracic mobs, Rt scapular mobilization; STM thoracic paraspinals, infra/supraspinatus, upper trap Supine suboccipital release; AP nodding mobs for C0 on 1 with sidebend  bias Hooklying chin tuck, added head lift Upper trap stretch with sheet for first rib mob Review of seated postural alignment   Treatment                            02/03/22:  Trigger Point Dry Needling, Manual Therapy Treatment:  Initial or subsequent education regarding Trigger Point Dry Needling: Subsequent Did patient give consent to treatment with Trigger Point Dry Needling: Yes TPDN with skilled palpation and monitoring followed by STM to the following muscles: bil upper trap, Rt rhomboids Bil first rib inf mobs Gross rib mobs with focus on right side  Supine on half foam roll: belly breathing- hands on rib cage, in flexion 90 & 160; W with breathing, snow angels with elbows flexed; core: marching, bridge rolls with ball bw knees Seated postural alignment/core contraction      PATIENT EDUCATION:  Education details: Educated pt on anatomy and physiology of current symptoms, FOTO, diagnosis, prognosis, HEP,  and POC. Person educated: Patient Education method: Medical illustrator Education comprehension: verbalized understanding and returned demonstration   HOME EXERCISE PROGRAM: Access Code: [email or web address] URL: [email or web address]    ASSESSMENT:   CLINICAL IMPRESSION:  Therapy reviewed use of trigger points for specific types of headaches. We also performed needling for several areas. She had a great twtich with all needles. We reviewed posterior chain strengthening for home. She spends a lot of her day on a computer. She was advised she should make these part of her weekly routine. We reviewed her HEP. We will continue 2-3 more visits. Re-assess next visit.   Decrease in headache intensity indicates progress toward reducing myofascial contribution to migraines.  Is working on creating a proper ergonomic set up at home and will continue to progress towards anterior cervical and core stability.   OBJECTIVE  IMPAIRMENTS: decreased activity tolerance,  decreased shoulder mobility, decreased strength, impaired flexibility, impaired UE use, postural dysfunction, and pain.   ACTIVITY LIMITATIONS: reaching, lifting, carry,  cleaning, driving, and or occupation   PERSONAL FACTORS: also affecting patient's functional outcome.        GOALS: Short term PT Goals Target date: 01/27/2022 Pt will be I and compliant with HEP. Baseline:  Goal status: achieved   Long term PT goals Target date: 02/17/2022 Pt will report no headaches for a week duration  Baseline: Goal status: New Pt will improve  Rt shoulder strength to at least 5/5 MMT to improve functional strength Baseline: Goal status: New Pt will improve FOTO to at least 69% functional to show improved function Baseline: Goal status: New Pt will reduce pain to overall less than <1/10 with usual activity and work activity. Baseline: Goal status: New       5. Pt will demonstrate good posture and ergonomic posture when working.  Baseline:  Goal status:     PLAN: PT FREQUENCY: 1x per week    PT DURATION: 6 weeks   PLANNED INTERVENTIONS (unless contraindicated): aquatic PT, Canalith repositioning, cryotherapy, Electrical stimulation, Iontophoresis with 4 mg/ml dexamethasome, Moist heat, traction, Ultrasound, gait training, Therapeutic exercise, balance training, neuromuscular re-education, patient/family education, prosthetic training, manual techniques, passive ROM, dry needling, taping, vasopnuematic device, vestibular, spinal manipulations, joint manipulations   PLAN FOR NEXT SESSION: review HEP, DN, Strengthening parascapular muscles. Ergonomics while working. Dry needling suboccipitals.   Lorayne Bender PT DPT  02/17/22 1:34 PM

## 2022-02-24 ENCOUNTER — Encounter (HOSPITAL_BASED_OUTPATIENT_CLINIC_OR_DEPARTMENT_OTHER): Payer: Self-pay | Admitting: Physical Therapy

## 2022-02-24 ENCOUNTER — Ambulatory Visit (HOSPITAL_BASED_OUTPATIENT_CLINIC_OR_DEPARTMENT_OTHER): Payer: BC Managed Care – PPO | Attending: Nurse Practitioner | Admitting: Physical Therapy

## 2022-02-24 DIAGNOSIS — R252 Cramp and spasm: Secondary | ICD-10-CM | POA: Insufficient documentation

## 2022-02-24 DIAGNOSIS — R293 Abnormal posture: Secondary | ICD-10-CM | POA: Diagnosis not present

## 2022-02-24 DIAGNOSIS — M542 Cervicalgia: Secondary | ICD-10-CM | POA: Diagnosis not present

## 2022-02-24 NOTE — Therapy (Signed)
OUTPATIENT PHYSICAL THERAPY TREATMENT NOTE   Patient Name: Monique Ellison MRN: [id number] DOB:12-10-1985, 37 y.o., female Today's Date: 02/25/2022  PCP:  Orma Render, NP  REFERRING PROVIDER:  Orma Render, NP   END OF SESSION:   PT End of Session - 02/24/22 1604     Visit Number 6    Number of Visits 14    Date for PT Re-Evaluation 04/16/21    Authorization Type BCBS    PT Start Time 1602    PT Stop Time 1645    PT Time Calculation (min) 43 min    Activity Tolerance Patient tolerated treatment well    Behavior During Therapy [facility] for tasks assessed/performed               Past Medical History:  Diagnosis Date   Family history of heart disease 05/29/2015   Father had MI in 48's   Former smoker 05/29/2015   Quit 2008   GERD (gastroesophageal reflux disease) 05/29/2015   Seasonal allergies    Past Surgical History:  Procedure Laterality Date   WISDOM TOOTH EXTRACTION     Patient Active Problem List   Diagnosis Date Noted   Dizziness 09/13/2021   Abnormal cervical Papanicolaou smear 02/07/2020   Family history of heart disease 08/16/2017   Anxiety 02/23/2017   Panic attack 02/20/2017   Tension headache 08/22/2016   GERD (gastroesophageal reflux disease) 05/29/2015   Former smoker 05/29/2015    REFERRING DIAG: Intractable migraine without aura and without status migrainosus [G43.019]    THERAPY DIAG:  Abnormal posture  Cervicalgia  Rationale for Evaluation and Treatment Rehabilitation  PERTINENT HISTORY: None  PRECAUTIONS: None  SUBJECTIVE:                                                                                                                                                                                      SUBJECTIVE STATEMENT:   It has been about 3 weeks since I have needed to disappear into a dark room and lay down   PAIN:  Are you having pain? Yes: NPRS scale: 0      Pain location: Rt post shoulder  Pain description: Pin point  pain in her R UT.  Aggravating factors: Sleeping wrong.  Relieving factors: Dry needling, tennis ball on knot, heat.  OBJECTIVE:    DIAGNOSTIC FINDINGS:  None   PATIENT SURVEYS:  FOTO 65.51%, 69% in 10 visits FOTO 02/24/22: 61     POSTURE: 1/4: keeps shoulders back but does bend forward ahead of pelvis, bil winging scapula   PALPATION: R UT       CERVICAL ROM:    Active ROM  A/PROM (deg) eval  Flexion WFL  Extension WFL  Right lateral flexion WFL  Left lateral flexion WFL  Right rotation WFL  Left rotation WFL   (Blank rows = not tested)   UPPER EXTREMITY ROM:   Active ROM Right eval Left eval  Shoulder flexion [facility]  Shoulder abduction      Shoulder internal rotation [facility]  Shoulder external rotation WFL WFL   (Blank rows = not tested)   UPPER EXTREMITY MMT:   MMT Right eval Left eval Rt/Lt 1/4 (lb)  Shoulder flexion 4+/5 4+/5 28.7/27.3  Shoulder internal rotation 4+/5 4+/5 15.8/15.9  Shoulder external rotation 4+/5 4+/5 16.3/14.0   (Blank rows = not tested)  1/4: pt reported tightness in right upper trap to create flexion press.      TODAY'S TREATMENT:  Treatment                            02/24/22:  Trigger Point Dry Needling, Manual Therapy Treatment:  Initial or subsequent education regarding Trigger Point Dry Needling: Subsequent Did patient give consent to treatment with Trigger Point Dry Needling: Yes TPDN with skilled palpation and monitoring followed by STM to the following muscles: Rt levator scap, upper trap, rhomboid, Lt suboccipitals  Prone rib mobilizations grade 4 with multiple cavitations bilaterally Supine snow angel  Supine horiz abd green tband Hooklying ER hands behind head- press elbows back with inhale for rib mobility Supine with legs straight, UE flexion- reach for length with inhale. Seated round back   12/28 Trigger Point Dry-Needling  Treatment instructions: Expect mild to moderate muscle soreness. S/S of  pneumothorax if dry needled over a lung field, and to seek immediate medical attention should they occur. Patient verbalized understanding of these instructions and education.  Patient Consent Given: No Education handout provided: No Muscles treated: upper trap 1x bilateral sub-occipitals 1x bilateral cervical paraspinal C7 1x bilateral  Electrical stimulation performed: No Parameters: N/A Treatment response/outcome:  great twitch to all   Reviewed use of trigger point for specific headaches using the thera-cane She has a thera-cane at home   Row 2x15 red  Shoulder extension 2x15 red      Treatment                            02/08/22:  Manual prone rib and thoracic mobs, Rt scapular mobilization; STM thoracic paraspinals, infra/supraspinatus, upper trap Supine suboccipital release; AP nodding mobs for C0 on 1 with sidebend bias Hooklying chin tuck, added head lift Upper trap stretch with sheet for first rib mob Review of seated postural alignment      PATIENT EDUCATION:  Education details: Educated pt on anatomy and physiology of current symptoms, FOTO, diagnosis, prognosis, HEP,  and POC. Person educated: Patient Education method: Medical illustrator Education comprehension: verbalized understanding and returned demonstration   HOME EXERCISE PROGRAM: Access Code: [email or web address] URL: [email or web address]    ASSESSMENT:   CLINICAL IMPRESSION:  Patient reported significant improvement in flexibility and mobility following manual therapy today.  Continues to have difficulty utilizing core without increased thoracic kyphosis. FOTO score did decrease but patient reports that she is having less frequent and less intense headaches since beginning therapy.  Will continue to benefit from further stability training to reduce overuse/misuse of cervical musculature.   OBJECTIVE IMPAIRMENTS: decreased activity tolerance, decreased shoulder mobility, decreased  strength, impaired flexibility, impaired UE use, postural dysfunction, and pain.  ACTIVITY LIMITATIONS: reaching, lifting, carry,  cleaning, driving, and or occupation   PERSONAL FACTORS: also affecting patient's functional outcome.        GOALS: Short term PT Goals Target date: 01/27/2022 Pt will be I and compliant with HEP. Baseline:  Goal status: achieved   Long term PT goals Target date: 02/17/2022 Pt will report no headaches for a week duration  Baseline: have not had to lay down in a dark room for about 3 weeks.  Goal status: achieved Pt will improve  Rt shoulder strength to at least 5/5 MMT to improve functional strength Baseline:  Goal status: New Pt will improve FOTO to at least 69% functional to show improved function Baseline: Goal status: New Pt will reduce pain to overall less than <1/10 with usual activity and work activity. Baseline: 3/10 Goal status: ongoing       5. Pt will demonstrate good posture and ergonomic posture when working.  Baseline: I am more cognisant of my posture, noted that she still sits forward over pelvis Goal status:     PLAN: PT FREQUENCY: 1x per week    PT DURATION: 6 weeks   PLANNED INTERVENTIONS (unless contraindicated): aquatic PT, Canalith repositioning, cryotherapy, Electrical stimulation, Iontophoresis with 4 mg/ml dexamethasome, Moist heat, traction, Ultrasound, gait training, Therapeutic exercise, balance training, neuromuscular re-education, patient/family education, prosthetic training, manual techniques, passive ROM, dry needling, taping, vasopnuematic device, vestibular, spinal manipulations, joint manipulations   PLAN FOR NEXT SESSION: review HEP, DN, Strengthening parascapular muscles. Ergonomics while working. Dry needling suboccipitals.   Bria Sparr C. Mehtab Dolberry PT, DPT 02/25/22 11:15 AM

## 2022-02-25 ENCOUNTER — Other Ambulatory Visit (HOSPITAL_COMMUNITY): Payer: Self-pay

## 2022-03-01 ENCOUNTER — Encounter: Payer: Self-pay | Admitting: Internal Medicine

## 2022-03-03 ENCOUNTER — Encounter (HOSPITAL_BASED_OUTPATIENT_CLINIC_OR_DEPARTMENT_OTHER): Payer: Self-pay | Admitting: Physical Therapy

## 2022-03-03 ENCOUNTER — Ambulatory Visit (HOSPITAL_BASED_OUTPATIENT_CLINIC_OR_DEPARTMENT_OTHER): Payer: BC Managed Care – PPO | Admitting: Physical Therapy

## 2022-03-03 DIAGNOSIS — M542 Cervicalgia: Secondary | ICD-10-CM

## 2022-03-03 DIAGNOSIS — R293 Abnormal posture: Secondary | ICD-10-CM

## 2022-03-03 DIAGNOSIS — R252 Cramp and spasm: Secondary | ICD-10-CM | POA: Diagnosis not present

## 2022-03-03 NOTE — Therapy (Signed)
OUTPATIENT PHYSICAL THERAPY TREATMENT NOTE   Patient Name: Monique Ellison MRN: [id number] DOB:03-Dec-1985, 37 y.o., female Today's Date: 03/03/2022  PCP:  Orma Render, NP  REFERRING PROVIDER:  Orma Render, NP   END OF SESSION:   PT End of Session - 03/03/22 1651     Visit Number 7    Number of Visits 14    Date for PT Re-Evaluation 04/16/21    Authorization Type BCBS    PT Start Time 1650    PT Stop Time 1730    PT Time Calculation (min) 40 min    Activity Tolerance Patient tolerated treatment well    Behavior During Therapy [facility] for tasks assessed/performed               Past Medical History:  Diagnosis Date   Family history of heart disease 05/29/2015   Father had MI in 81's   Former smoker 05/29/2015   Quit 2008   GERD (gastroesophageal reflux disease) 05/29/2015   Seasonal allergies    Past Surgical History:  Procedure Laterality Date   WISDOM TOOTH EXTRACTION     Patient Active Problem List   Diagnosis Date Noted   Dizziness 09/13/2021   Abnormal cervical Papanicolaou smear 02/07/2020   Family history of heart disease 08/16/2017   Anxiety 02/23/2017   Panic attack 02/20/2017   Tension headache 08/22/2016   GERD (gastroesophageal reflux disease) 05/29/2015   Former smoker 05/29/2015    REFERRING DIAG: Intractable migraine without aura and without status migrainosus [G43.019]    THERAPY DIAG:  Abnormal posture  Cervicalgia  Cramp and spasm  Rationale for Evaluation and Treatment Rehabilitation  PERTINENT HISTORY: None  PRECAUTIONS: None  SUBJECTIVE:                                                                                                                                                                                      SUBJECTIVE STATEMENT:   I had a HA but ibuprofen took care of it. More aware of posture.   PAIN:  Are you having pain? Yes: NPRS scale: 0      Pain location: Rt post shoulder  Pain description: Pin point pain in  her R UT.  Aggravating factors: Sleeping wrong.  Relieving factors: Dry needling, tennis ball on knot, heat.  OBJECTIVE:    DIAGNOSTIC FINDINGS:  None   PATIENT SURVEYS:  FOTO 65.51%, 69% in 10 visits FOTO 02/24/22: 61     POSTURE: 1/4: keeps shoulders back but does bend forward ahead of pelvis, bil winging scapula   PALPATION: R UT       CERVICAL ROM:    Active ROM A/PROM (  deg) eval  Flexion WFL  Extension WFL  Right lateral flexion WFL  Left lateral flexion WFL  Right rotation WFL  Left rotation WFL   (Blank rows = not tested)   UPPER EXTREMITY ROM:   Active ROM Right eval Left eval  Shoulder flexion [facility]  Shoulder abduction      Shoulder internal rotation [facility]  Shoulder external rotation WFL WFL   (Blank rows = not tested)   UPPER EXTREMITY MMT:   MMT Right eval Left eval Rt/Lt 1/4 (lb)  Shoulder flexion 4+/5 4+/5 28.7/27.3  Shoulder internal rotation 4+/5 4+/5 15.8/15.9  Shoulder external rotation 4+/5 4+/5 16.3/14.0   (Blank rows = not tested)  1/4: pt reported tightness in right upper trap to create flexion press.      TODAY'S TREATMENT:  Treatment                            03/03/22:  Prone manual rib and thoracic mobs with cavitations noted Prone thoracic extension with forehead on hands Prone Y, T 1lb Kneeling hinge flys, triceps kicks and Y with 1lb Qped: protraction/retraction, rows, alt UE flexion   Treatment                            02/24/22:  Trigger Point Dry Needling, Manual Therapy Treatment:  Initial or subsequent education regarding Trigger Point Dry Needling: Subsequent Did patient give consent to treatment with Trigger Point Dry Needling: Yes TPDN with skilled palpation and monitoring followed by STM to the following muscles: Rt levator scap, upper trap, rhomboid, Lt suboccipitals  Prone rib mobilizations grade 4 with multiple cavitations bilaterally Supine snow angel  Supine horiz abd green tband Hooklying ER hands  behind head- press elbows back with inhale for rib mobility Supine with legs straight, UE flexion- reach for length with inhale. Seated round back   12/28 Trigger Point Dry-Needling  Treatment instructions: Expect mild to moderate muscle soreness. S/S of pneumothorax if dry needled over a lung field, and to seek immediate medical attention should they occur. Patient verbalized understanding of these instructions and education.  Patient Consent Given: No Education handout provided: No Muscles treated: upper trap 1x bilateral sub-occipitals 1x bilateral cervical paraspinal C7 1x bilateral  Electrical stimulation performed: No Parameters: N/A Treatment response/outcome:  great twitch to all   Reviewed use of trigger point for specific headaches using the thera-cane She has a thera-cane at home   Row 2x15 red  Shoulder extension 2x15 red        PATIENT EDUCATION:  Education details: Educated pt on anatomy and physiology of current symptoms, FOTO, diagnosis, prognosis, HEP,  and POC. Person educated: Patient Education method: Medical illustrator Education comprehension: verbalized understanding and returned demonstration   HOME EXERCISE PROGRAM: Access Code: [email or web address] URL: [email or web address]    ASSESSMENT:   CLINICAL IMPRESSION:  Difficulty with protraction control noted in CKC qped exercises. Tactile cues/assist required for scapular motion in prone.    OBJECTIVE IMPAIRMENTS: decreased activity tolerance, decreased shoulder mobility, decreased strength, impaired flexibility, impaired UE use, postural dysfunction, and pain.   ACTIVITY LIMITATIONS: reaching, lifting, carry,  cleaning, driving, and or occupation   PERSONAL FACTORS: also affecting patient's functional outcome.        GOALS: Short term PT Goals Target date: 01/27/2022 Pt will be I and compliant with HEP. Baseline:  Goal status: achieved   Long term PT goals  Target date:  02/17/2022 Pt will report no headaches for a week duration  Baseline: have not had to lay down in a dark room for about 3 weeks.  Goal status: achieved Pt will improve  Rt shoulder strength to at least 5/5 MMT to improve functional strength Baseline:  Goal status: New Pt will improve FOTO to at least 69% functional to show improved function Baseline: Goal status: New Pt will reduce pain to overall less than <1/10 with usual activity and work activity. Baseline: 3/10 Goal status: ongoing       5. Pt will demonstrate good posture and ergonomic posture when working.  Baseline: I am more cognisant of my posture, noted that she still sits forward over pelvis Goal status:     PLAN: PT FREQUENCY: 1x per week    PT DURATION: 6 weeks   PLANNED INTERVENTIONS (unless contraindicated): aquatic PT, Canalith repositioning, cryotherapy, Electrical stimulation, Iontophoresis with 4 mg/ml dexamethasome, Moist heat, traction, Ultrasound, gait training, Therapeutic exercise, balance training, neuromuscular re-education, patient/family education, prosthetic training, manual techniques, passive ROM, dry needling, taping, vasopnuematic device, vestibular, spinal manipulations, joint manipulations   PLAN FOR NEXT SESSION: review HEP, DN, Strengthening parascapular muscles. Ergonomics while working. Dry needling suboccipitals.   Lazar Tierce C. Karmyn Lowman PT, DPT 03/03/22 5:39 PM

## 2022-03-10 ENCOUNTER — Encounter (HOSPITAL_BASED_OUTPATIENT_CLINIC_OR_DEPARTMENT_OTHER): Payer: Self-pay | Admitting: Physical Therapy

## 2022-03-10 ENCOUNTER — Ambulatory Visit (HOSPITAL_BASED_OUTPATIENT_CLINIC_OR_DEPARTMENT_OTHER): Payer: BC Managed Care – PPO | Admitting: Physical Therapy

## 2022-03-10 DIAGNOSIS — R252 Cramp and spasm: Secondary | ICD-10-CM | POA: Diagnosis not present

## 2022-03-10 DIAGNOSIS — M542 Cervicalgia: Secondary | ICD-10-CM | POA: Diagnosis not present

## 2022-03-10 DIAGNOSIS — R293 Abnormal posture: Secondary | ICD-10-CM | POA: Diagnosis not present

## 2022-03-10 NOTE — Therapy (Signed)
OUTPATIENT PHYSICAL THERAPY TREATMENT NOTE   Patient Name: Monique Ellison MRN: [id number] DOB:January 26, 1986, 37 y.o., female Today's Date: 03/10/2022  PCP:  Tollie Eth, NP  REFERRING PROVIDER:  Tollie Eth, NP   END OF SESSION:   PT End of Session - 03/10/22 1644     Visit Number 8    Number of Visits 14    Date for PT Re-Evaluation 04/16/21    Authorization Type BCBS    PT Start Time 1644    PT Stop Time 1725    PT Time Calculation (min) 41 min    Activity Tolerance Patient tolerated treatment well    Behavior During Therapy [facility] for tasks assessed/performed               Past Medical History:  Diagnosis Date   Family history of heart disease 05/29/2015   Father had MI in 69's   Former smoker 05/29/2015   Quit 2008   GERD (gastroesophageal reflux disease) 05/29/2015   Seasonal allergies    Past Surgical History:  Procedure Laterality Date   WISDOM TOOTH EXTRACTION     Patient Active Problem List   Diagnosis Date Noted   Dizziness 09/13/2021   Abnormal cervical Papanicolaou smear 02/07/2020   Family history of heart disease 08/16/2017   Anxiety 02/23/2017   Panic attack 02/20/2017   Tension headache 08/22/2016   GERD (gastroesophageal reflux disease) 05/29/2015   Former smoker 05/29/2015    REFERRING DIAG: Intractable migraine without aura and without status migrainosus [G43.019]    THERAPY DIAG:  Abnormal posture  Cervicalgia  Cramp and spasm  Rationale for Evaluation and Treatment Rehabilitation  PERTINENT HISTORY: None  PRECAUTIONS: None  SUBJECTIVE:                                                                                                                                                                                      SUBJECTIVE STATEMENT:   It dawned on my that it does not bother me as much. Dont feel like I am reaching for the LAX ball as often.  PAIN:  Are you having pain? Yes: NPRS scale: 0      Pain location: Rt post  shoulder  Pain description: Pin point pain in her R UT.  Aggravating factors: Sleeping wrong.  Relieving factors: Dry needling, tennis ball on knot, heat.  OBJECTIVE:    DIAGNOSTIC FINDINGS:  None   PATIENT SURVEYS:  FOTO 65.51%, 69% in 10 visits FOTO 02/24/22: 61     POSTURE: 1/4: keeps shoulders back but does bend forward ahead of pelvis, bil winging scapula   PALPATION: R UT  CERVICAL ROM:    Active ROM A/PROM (deg) eval  Flexion WFL  Extension WFL  Right lateral flexion WFL  Left lateral flexion WFL  Right rotation WFL  Left rotation WFL   (Blank rows = not tested)   UPPER EXTREMITY ROM:   Active ROM Right eval Left eval  Shoulder flexion [facility]  Shoulder abduction      Shoulder internal rotation [facility]  Shoulder external rotation WFL WFL   (Blank rows = not tested)   UPPER EXTREMITY MMT:   MMT Right eval Left eval Rt/Lt 1/4 (lb)  Shoulder flexion 4+/5 4+/5 28.7/27.3  Shoulder internal rotation 4+/5 4+/5 15.8/15.9  Shoulder external rotation 4+/5 4+/5 16.3/14.0   (Blank rows = not tested)  1/4: pt reported tightness in right upper trap to create flexion press.      TODAY'S TREATMENT:  Treatment                            03/10/22:  Bent over OH press 2lb Bent over & upright GHJ ER holding bars to sides Lat pull down Triceps cable Row cable Upper trap stretch Supine manual to Rt upper trap & levator Mcconnell tape for Rt upper trap inhibition   Treatment                            03/03/22:  Prone manual rib and thoracic mobs with cavitations noted Prone thoracic extension with forehead on hands Prone Y, T 1lb Kneeling hinge flys, triceps kicks and Y with 1lb Qped: protraction/retraction, rows, alt UE flexion   Treatment                            02/24/22:  Trigger Point Dry Needling, Manual Therapy Treatment:  Initial or subsequent education regarding Trigger Point Dry Needling: Subsequent Did patient give consent to treatment  with Trigger Point Dry Needling: Yes TPDN with skilled palpation and monitoring followed by STM to the following muscles: Rt levator scap, upper trap, rhomboid, Lt suboccipitals  Prone rib mobilizations grade 4 with multiple cavitations bilaterally Supine snow angel  Supine horiz abd green tband Hooklying ER hands behind head- press elbows back with inhale for rib mobility Supine with legs straight, UE flexion- reach for length with inhale. Seated round back   12/28 Trigger Point Dry-Needling  Treatment instructions: Expect mild to moderate muscle soreness. S/S of pneumothorax if dry needled over a lung field, and to seek immediate medical attention should they occur. Patient verbalized understanding of these instructions and education.  Patient Consent Given: No Education handout provided: No Muscles treated: upper trap 1x bilateral sub-occipitals 1x bilateral cervical paraspinal C7 1x bilateral  Electrical stimulation performed: No Parameters: N/A Treatment response/outcome:  great twitch to all   Reviewed use of trigger point for specific headaches using the thera-cane She has a thera-cane at home   Row 2x15 red  Shoulder extension 2x15 red        PATIENT EDUCATION:  Education details: Educated pt on anatomy and physiology of current symptoms, FOTO, diagnosis, prognosis, HEP,  and POC. Person educated: Patient Education method: Customer service manager Education comprehension: verbalized understanding and returned demonstration   HOME EXERCISE PROGRAM: Access Code: [email or web address] URL: [email or web address]    ASSESSMENT:   CLINICAL IMPRESSION:  Worked on engaging lats with lower traps to decrease abd during ER motions. Tightness notable  in small portion of Rt upper trap which is improving.    OBJECTIVE IMPAIRMENTS: decreased activity tolerance, decreased shoulder mobility, decreased strength, impaired flexibility, impaired UE use, postural dysfunction, and  pain.   ACTIVITY LIMITATIONS: reaching, lifting, carry,  cleaning, driving, and or occupation   PERSONAL FACTORS: also affecting patient's functional outcome.        GOALS: Short term PT Goals Target date: 01/27/2022 Pt will be I and compliant with HEP. Baseline:  Goal status: achieved   Long term PT goals Target date: 02/17/2022 Pt will report no headaches for a week duration  Baseline: have not had to lay down in a dark room for about 3 weeks.  Goal status: achieved Pt will improve  Rt shoulder strength to at least 5/5 MMT to improve functional strength Baseline:  Goal status: achieved Pt will improve FOTO to at least 69% functional to show improved function Baseline: 68 on 1/18 Goal status: New Pt will reduce pain to overall less than <1/10 with usual activity and work activity. Baseline: 3/10 Goal status: ongoing       5. Pt will demonstrate good posture and ergonomic posture when working.  Baseline: I am more cognisant of my posture, noted that she still sits forward over pelvis Goal status:     PLAN: PT FREQUENCY: 1x per week    PT DURATION: 6 weeks   PLANNED INTERVENTIONS (unless contraindicated): aquatic PT, Canalith repositioning, cryotherapy, Electrical stimulation, Iontophoresis with 4 mg/ml dexamethasome, Moist heat, traction, Ultrasound, gait training, Therapeutic exercise, balance training, neuromuscular re-education, patient/family education, prosthetic training, manual techniques, passive ROM, dry needling, taping, vasopnuematic device, vestibular, spinal manipulations, joint manipulations   PLAN FOR NEXT SESSION: review HEP, DN, Strengthening parascapular muscles. Ergonomics while working. Dry needling suboccipitals.   Nilda Keathley C. Courtlyn Aki PT, DPT 03/10/22 5:26 PM

## 2022-03-17 ENCOUNTER — Ambulatory Visit (HOSPITAL_BASED_OUTPATIENT_CLINIC_OR_DEPARTMENT_OTHER): Payer: BC Managed Care – PPO | Admitting: Physical Therapy

## 2022-03-17 DIAGNOSIS — R252 Cramp and spasm: Secondary | ICD-10-CM | POA: Diagnosis not present

## 2022-03-17 DIAGNOSIS — R293 Abnormal posture: Secondary | ICD-10-CM | POA: Diagnosis not present

## 2022-03-17 DIAGNOSIS — M542 Cervicalgia: Secondary | ICD-10-CM | POA: Diagnosis not present

## 2022-03-17 NOTE — Therapy (Signed)
OUTPATIENT PHYSICAL THERAPY TREATMENT NOTE   Patient Name: Monique Ellison MRN: [id number] DOB:05-22-1985, 37 y.o., female Today's Date: 03/17/2022  PCP:  Orma Render, NP  REFERRING PROVIDER:  Orma Render, NP   END OF SESSION:       Past Medical History:  Diagnosis Date   Family history of heart disease 05/29/2015   Father had MI in 51's   Former smoker 05/29/2015   Quit 2008   GERD (gastroesophageal reflux disease) 05/29/2015   Seasonal allergies    Past Surgical History:  Procedure Laterality Date   WISDOM TOOTH EXTRACTION     Patient Active Problem List   Diagnosis Date Noted   Dizziness 09/13/2021   Abnormal cervical Papanicolaou smear 02/07/2020   Family history of heart disease 08/16/2017   Anxiety 02/23/2017   Panic attack 02/20/2017   Tension headache 08/22/2016   GERD (gastroesophageal reflux disease) 05/29/2015   Former smoker 05/29/2015    REFERRING DIAG: Intractable migraine without aura and without status migrainosus [G43.019]    THERAPY DIAG:  No diagnosis found.  Rationale for Evaluation and Treatment Rehabilitation  PERTINENT HISTORY: None  PRECAUTIONS: None  SUBJECTIVE:                                                                                                                                                                                      SUBJECTIVE STATEMENT:   It dawned on my that it does not bother me as much. Dont feel like I am reaching for the LAX ball as often.  PAIN:  Are you having pain? Yes: NPRS scale: 0      Pain location: Rt post shoulder  Pain description: Pin point pain in her R UT.  Aggravating factors: Sleeping wrong.  Relieving factors: Dry needling, tennis ball on knot, heat.  OBJECTIVE:    DIAGNOSTIC FINDINGS:  None   PATIENT SURVEYS:  FOTO 65.51%, 69% in 10 visits FOTO 02/24/22: 61     POSTURE: 1/4: keeps shoulders back but does bend forward ahead of pelvis, bil winging scapula   PALPATION: R UT        CERVICAL ROM:    Active ROM A/PROM (deg) eval  Flexion WFL  Extension WFL  Right lateral flexion WFL  Left lateral flexion WFL  Right rotation WFL  Left rotation WFL   (Blank rows = not tested)   UPPER EXTREMITY ROM:   Active ROM Right eval Left eval  Shoulder flexion [facility]  Shoulder abduction      Shoulder internal rotation [facility]  Shoulder external rotation WFL WFL   (Blank rows = not tested)   UPPER EXTREMITY MMT:  MMT Right eval Left eval Rt/Lt 1/4 (lb)  Shoulder flexion 4+/5 4+/5 28.7/27.3  Shoulder internal rotation 4+/5 4+/5 15.8/15.9  Shoulder external rotation 4+/5 4+/5 16.3/14.0   (Blank rows = not tested)  1/4: pt reported tightness in right upper trap to create flexion press.      TODAY'S TREATMENT:  1/25   Wand stretch overhead 3lbs x15  Open book stretch x5 5 sec hold billateral   Cable:  Row 2x15 15 lbs  Extensions 2x15 10 lbs  Chop x15 each side 5 lbs  Pallof press x15 5 lbs   Manual: trigger point release to upper trap ; sub-occipital release          Treatment                            03/10/22:  Bent over OH press 2lb Bent over & upright GHJ ER holding bars to sides Lat pull down Triceps cable Row cable Upper trap stretch Supine manual to Rt upper trap & levator Mcconnell tape for Rt upper trap inhibition   Treatment                            03/03/22:  Prone manual rib and thoracic mobs with cavitations noted Prone thoracic extension with forehead on hands Prone Y, T 1lb Kneeling hinge flys, triceps kicks and Y with 1lb Qped: protraction/retraction, rows, alt UE flexion   Treatment                            02/24/22:  Trigger Point Dry Needling, Manual Therapy Treatment:  Initial or subsequent education regarding Trigger Point Dry Needling: Subsequent Did patient give consent to treatment with Trigger Point Dry Needling: Yes TPDN with skilled palpation and monitoring followed by STM to the following  muscles: Rt levator scap, upper trap, rhomboid, Lt suboccipitals  Prone rib mobilizations grade 4 with multiple cavitations bilaterally Supine snow angel  Supine horiz abd green tband Hooklying ER hands behind head- press elbows back with inhale for rib mobility Supine with legs straight, UE flexion- reach for length with inhale. Seated round back   12/28 Trigger Point Dry-Needling  Treatment instructions: Expect mild to moderate muscle soreness. S/S of pneumothorax if dry needled over a lung field, and to seek immediate medical attention should they occur. Patient verbalized understanding of these instructions and education.  Patient Consent Given: No Education handout provided: No Muscles treated: upper trap 1x bilateral sub-occipitals 1x bilateral cervical paraspinal C7 1x bilateral  Electrical stimulation performed: No Parameters: N/A Treatment response/outcome:  great twitch to all   Reviewed use of trigger point for specific headaches using the thera-cane She has a thera-cane at home   Row 2x15 red  Shoulder extension 2x15 red        PATIENT EDUCATION:  Education details: Educated pt on anatomy and physiology of current symptoms, FOTO, diagnosis, prognosis, HEP,  and POC. Person educated: Patient Education method: Customer service manager Education comprehension: verbalized understanding and returned demonstration   HOME EXERCISE PROGRAM: Access Code: [email or web address] URL: [email or web address]    ASSESSMENT:   CLINICAL IMPRESSION:  We reviewed exercises she can do in series at the gym. She reported minor tightness but no pain. We also performed manual therapy to upper trap and cervical area. Her muscle tightness appears to be improving.   OBJECTIVE IMPAIRMENTS: decreased activity tolerance,  decreased shoulder mobility, decreased strength, impaired flexibility, impaired UE use, postural dysfunction, and pain.   ACTIVITY LIMITATIONS: reaching, lifting,  carry,  cleaning, driving, and or occupation   PERSONAL FACTORS: also affecting patient's functional outcome.        GOALS: Short term PT Goals Target date: 01/27/2022 Pt will be I and compliant with HEP. Baseline:  Goal status: achieved   Long term PT goals Target date: 02/17/2022 Pt will report no headaches for a week duration  Baseline: have not had to lay down in a dark room for about 3 weeks.  Goal status: achieved Pt will improve  Rt shoulder strength to at least 5/5 MMT to improve functional strength Baseline:  Goal status: achieved Pt will improve FOTO to at least 69% functional to show improved function Baseline: 68 on 1/18 Goal status: New Pt will reduce pain to overall less than <1/10 with usual activity and work activity. Baseline: 3/10 Goal status: ongoing       5. Pt will demonstrate good posture and ergonomic posture when working.  Baseline: I am more cognisant of my posture, noted that she still sits forward over pelvis Goal status:     PLAN: PT FREQUENCY: 1x per week    PT DURATION: 6 weeks   PLANNED INTERVENTIONS (unless contraindicated): aquatic PT, Canalith repositioning, cryotherapy, Electrical stimulation, Iontophoresis with 4 mg/ml dexamethasome, Moist heat, traction, Ultrasound, gait training, Therapeutic exercise, balance training, neuromuscular re-education, patient/family education, prosthetic training, manual techniques, passive ROM, dry needling, taping, vasopnuematic device, vestibular, spinal manipulations, joint manipulations   PLAN FOR NEXT SESSION: review HEP, DN, Strengthening parascapular muscles. Ergonomics while working. Dry needling suboccipitals.   Jessica C. Hightower PT, DPT 03/17/22 8:09 AM

## 2022-03-21 DIAGNOSIS — F411 Generalized anxiety disorder: Secondary | ICD-10-CM | POA: Diagnosis not present

## 2022-03-21 DIAGNOSIS — F32A Depression, unspecified: Secondary | ICD-10-CM | POA: Diagnosis not present

## 2022-03-24 ENCOUNTER — Ambulatory Visit (HOSPITAL_BASED_OUTPATIENT_CLINIC_OR_DEPARTMENT_OTHER): Payer: BC Managed Care – PPO | Attending: Nurse Practitioner | Admitting: Physical Therapy

## 2022-03-24 ENCOUNTER — Encounter (HOSPITAL_BASED_OUTPATIENT_CLINIC_OR_DEPARTMENT_OTHER): Payer: Self-pay | Admitting: Physical Therapy

## 2022-03-24 DIAGNOSIS — M542 Cervicalgia: Secondary | ICD-10-CM | POA: Diagnosis not present

## 2022-03-24 DIAGNOSIS — R252 Cramp and spasm: Secondary | ICD-10-CM | POA: Diagnosis not present

## 2022-03-24 DIAGNOSIS — R293 Abnormal posture: Secondary | ICD-10-CM | POA: Diagnosis not present

## 2022-03-24 DIAGNOSIS — R42 Dizziness and giddiness: Secondary | ICD-10-CM | POA: Diagnosis not present

## 2022-03-24 NOTE — Therapy (Signed)
OUTPATIENT PHYSICAL THERAPY TREATMENT NOTE   Patient Name: Monique Ellison MRN: [id number] DOB:[birth year], 37 y.o., female Today's Date: 03/24/2022  PCP:  Orma Render, NP  REFERRING PROVIDER:  Orma Render, NP   END OF SESSION:   PT End of Session - 03/24/22 1431     Visit Number 10    Number of Visits 14    Date for PT Re-Evaluation 04/16/21    Authorization Type BCBS    PT Start Time 1430    PT Stop Time 1455    PT Time Calculation (min) 25 min    Activity Tolerance Patient tolerated treatment well    Behavior During Therapy [facility] for tasks assessed/performed                Past Medical History:  Diagnosis Date   Family history of heart disease 05/29/2015   Father had MI in 13's   Former smoker 05/29/2015   Quit 2008   GERD (gastroesophageal reflux disease) 05/29/2015   Seasonal allergies    Past Surgical History:  Procedure Laterality Date   WISDOM TOOTH EXTRACTION     Patient Active Problem List   Diagnosis Date Noted   Dizziness 09/13/2021   Abnormal cervical Papanicolaou smear 02/07/2020   Family history of heart disease 08/16/2017   Anxiety 02/23/2017   Panic attack 02/20/2017   Tension headache 08/22/2016   GERD (gastroesophageal reflux disease) 05/29/2015   Former smoker 05/29/2015    REFERRING DIAG: Intractable migraine without aura and without status migrainosus [G43.019]    THERAPY DIAG:  Abnormal posture  Cervicalgia  Cramp and spasm  Dizziness and giddiness  Rationale for Evaluation and Treatment Rehabilitation  PERTINENT HISTORY: None  PRECAUTIONS: None  SUBJECTIVE:                                                                                                                                                                                      SUBJECTIVE STATEMENT:   Been fighting a migraine. Took rescue meds at about 11. I can function but I am in a fog.  PAIN:  Are you having pain? Yes: NPRS scale: 0      Pain location: Rt  post shoulder  Pain description: Pin point pain in her R UT.  Aggravating factors: Sleeping wrong.  Relieving factors: Dry needling, tennis ball on knot, heat.  OBJECTIVE:    DIAGNOSTIC FINDINGS:  None   PATIENT SURVEYS:  FOTO 65.51%, 69% in 10 visits FOTO 02/24/22: 61     POSTURE: 1/4: keeps shoulders back but does bend forward ahead of pelvis, bil winging scapula   PALPATION: R UT  CERVICAL ROM:    Active ROM A/PROM (deg) eval  Flexion WFL  Extension WFL  Right lateral flexion WFL  Left lateral flexion WFL  Right rotation WFL  Left rotation WFL   (Blank rows = not tested)   UPPER EXTREMITY ROM:   Active ROM Right eval Left eval  Shoulder flexion [facility]  Shoulder abduction      Shoulder internal rotation [facility]  Shoulder external rotation WFL WFL   (Blank rows = not tested)   UPPER EXTREMITY MMT:   MMT Right eval Left eval Rt/Lt 1/4 (lb)  Shoulder flexion 4+/5 4+/5 28.7/27.3  Shoulder internal rotation 4+/5 4+/5 15.8/15.9  Shoulder external rotation 4+/5 4+/5 16.3/14.0   (Blank rows = not tested)  1/4: pt reported tightness in right upper trap to create flexion press.      TODAY'S TREATMENT:  Treatment                            03/24/22:  Trigger Point Dry Needling, Manual Therapy Treatment:  Initial or subsequent education regarding Trigger Point Dry Needling: Subsequent Did patient give consent to treatment with Trigger Point Dry Needling: Yes TPDN with skilled palpation and monitoring followed by STM to the following muscles: bil upper traps, left levator scap. Bil suboccpitials  Prone rib & thoracic mobs grade 3 with cavitation noted in mid-thoracic region Seated shoulder rolls & cervical AROM   1/25   Wand stretch overhead 3lbs x15  Open book stretch x5 5 sec hold billateral   Cable:  Row 2x15 15 lbs  Extensions 2x15 10 lbs  Chop x15 each side 5 lbs  Pallof press x15 5 lbs   Manual: trigger point release to upper trap ;  sub-occipital release          Treatment                            03/10/22:  Bent over OH press 2lb Bent over & upright [facility] ER holding bars to sides Lat pull down Triceps cable Row cable Upper trap stretch Supine manual to Rt upper trap & levator Mcconnell tape for Rt upper trap inhibition   Treatment                            03/03/22:  Prone manual rib and thoracic mobs with cavitations noted Prone thoracic extension with forehead on hands Prone Y, T 1lb Kneeling hinge flys, triceps kicks and Y with 1lb Qped: protraction/retraction, rows, alt UE flexion     PATIENT EDUCATION:  Education details: Educated pt on anatomy and physiology of current symptoms, FOTO, diagnosis, prognosis, HEP,  and POC. Person educated: Patient Education method: Customer service manager Education comprehension: verbalized understanding and returned demonstration   HOME EXERCISE PROGRAM: Access Code: [email or web address] URL: [email or web address]    ASSESSMENT:   CLINICAL IMPRESSION:  Patient arrived with migraine extending from bilateral upper traps through cervical spine into temporal lobes bilaterally.  She did take her rescue medication at about 11:00 this morning.  Utilize dry needling to reduce excessive spasm and tension through musculoskeletal system which patient did report feeling a release.  Head felt heavy following and soreness as expected but continues to feel headache pain in temporal lobes.  Asked her to go home and lay down for a little while to reduce headache pain and pain attention to patterns  as far as return of muscle tension in comparison to reaction of headache pain following dry needling.  OBJECTIVE IMPAIRMENTS: decreased activity tolerance, decreased shoulder mobility, decreased strength, impaired flexibility, impaired UE use, postural dysfunction, and pain.   ACTIVITY LIMITATIONS: reaching, lifting, carry,  cleaning, driving, and or occupation   PERSONAL  FACTORS: also affecting patient's functional outcome.        GOALS: Short term PT Goals Target date: 01/27/2022 Pt will be I and compliant with HEP. Baseline:  Goal status: achieved   Long term PT goals Target date: 02/17/2022 Pt will report no headaches for a week duration  Baseline: have not had to lay down in a dark room for about 3 weeks.  Goal status: achieved Pt will improve  Rt shoulder strength to at least 5/5 MMT to improve functional strength Baseline:  Goal status: achieved Pt will improve FOTO to at least 69% functional to show improved function Baseline: 68 on 1/18 Goal status: New Pt will reduce pain to overall less than <1/10 with usual activity and work activity. Baseline: 3/10 Goal status: ongoing       5. Pt will demonstrate good posture and ergonomic posture when working.  Baseline: I am more cognisant of my posture, noted that she still sits forward over pelvis Goal status:     PLAN: PT FREQUENCY: 1x per week    PT DURATION: 6 weeks   PLANNED INTERVENTIONS (unless contraindicated): aquatic PT, Canalith repositioning, cryotherapy, Electrical stimulation, Iontophoresis with 4 mg/ml dexamethasome, Moist heat, traction, Ultrasound, gait training, Therapeutic exercise, balance training, neuromuscular re-education, patient/family education, prosthetic training, manual techniques, passive ROM, dry needling, taping, vasopnuematic device, vestibular, spinal manipulations, joint manipulations   PLAN FOR NEXT SESSION: review HEP, DN, Strengthening parascapular muscles. Ergonomics while working. Dry needling suboccipitals.   Demaree Liberto C. Jameika Kinn PT, DPT 03/24/22 2:58 PM

## 2022-03-28 DIAGNOSIS — F411 Generalized anxiety disorder: Secondary | ICD-10-CM | POA: Diagnosis not present

## 2022-03-28 DIAGNOSIS — F32A Depression, unspecified: Secondary | ICD-10-CM | POA: Diagnosis not present

## 2022-03-31 ENCOUNTER — Ambulatory Visit (HOSPITAL_BASED_OUTPATIENT_CLINIC_OR_DEPARTMENT_OTHER): Payer: BC Managed Care – PPO | Admitting: Physical Therapy

## 2022-03-31 ENCOUNTER — Encounter (HOSPITAL_BASED_OUTPATIENT_CLINIC_OR_DEPARTMENT_OTHER): Payer: Self-pay | Admitting: Physical Therapy

## 2022-03-31 DIAGNOSIS — M542 Cervicalgia: Secondary | ICD-10-CM

## 2022-03-31 DIAGNOSIS — R42 Dizziness and giddiness: Secondary | ICD-10-CM | POA: Diagnosis not present

## 2022-03-31 DIAGNOSIS — R293 Abnormal posture: Secondary | ICD-10-CM

## 2022-03-31 DIAGNOSIS — R252 Cramp and spasm: Secondary | ICD-10-CM | POA: Diagnosis not present

## 2022-03-31 NOTE — Therapy (Signed)
OUTPATIENT PHYSICAL THERAPY TREATMENT NOTE   Patient Name: Monique Ellison MRN: [id number] DOB:Nov 04, 1985, 37 y.o., female Today's Date: 03/31/2022  PCP:  Orma Render, NP  REFERRING PROVIDER:  Orma Render, NP   END OF SESSION:   PT End of Session - 03/31/22 1442     Visit Number 11    Number of Visits 14    Date for PT Re-Evaluation 04/16/21    Authorization Type BCBS    PT Start Time 1442    PT Stop Time 1521    PT Time Calculation (min) 39 min    Activity Tolerance Patient tolerated treatment well    Behavior During Therapy [facility] for tasks assessed/performed                Past Medical History:  Diagnosis Date   Family history of heart disease 05/29/2015   Father had MI in 51's   Former smoker 05/29/2015   Quit 2008   GERD (gastroesophageal reflux disease) 05/29/2015   Seasonal allergies    Past Surgical History:  Procedure Laterality Date   WISDOM TOOTH EXTRACTION     Patient Active Problem List   Diagnosis Date Noted   Dizziness 09/13/2021   Abnormal cervical Papanicolaou smear 02/07/2020   Family history of heart disease 08/16/2017   Anxiety 02/23/2017   Panic attack 02/20/2017   Tension headache 08/22/2016   GERD (gastroesophageal reflux disease) 05/29/2015   Former smoker 05/29/2015    REFERRING DIAG: Intractable migraine without aura and without status migrainosus [G43.019]    THERAPY DIAG:  Abnormal posture  Cervicalgia  Rationale for Evaluation and Treatment Rehabilitation  PERTINENT HISTORY: None  PRECAUTIONS: None  SUBJECTIVE:                                                                                                                                                                                      SUBJECTIVE STATEMENT:   Lots of sleep after last appt and went to work with heating pad because I was still a llittle in a fog. Was probably 90-95% by Saturday. I have been doing stretches and heat at night. My neck is still tight but  not as bad.  PAIN:  Are you having pain? Yes: NPRS scale: 0      Pain location: Rt post shoulder  Pain description: Pin point pain in her R UT.  Aggravating factors: Sleeping wrong.  Relieving factors: Dry needling, tennis ball on knot, heat.  OBJECTIVE:    DIAGNOSTIC FINDINGS:  None   PATIENT SURVEYS:  FOTO 65.51%, 69% in 10 visits FOTO 02/24/22: 61     POSTURE: 1/4: keeps shoulders back but does  bend forward ahead of pelvis, bil winging scapula   PALPATION: R UT       CERVICAL ROM:    Active ROM A/PROM (deg) eval  Flexion WFL  Extension WFL  Right lateral flexion WFL  Left lateral flexion WFL  Right rotation WFL  Left rotation WFL   (Blank rows = not tested)   UPPER EXTREMITY ROM:   Active ROM Right eval Left eval  Shoulder flexion [facility]  Shoulder abduction      Shoulder internal rotation [facility]  Shoulder external rotation WFL WFL   (Blank rows = not tested)   UPPER EXTREMITY MMT:   MMT Right eval Left eval Rt/Lt 1/4 (lb)  Shoulder flexion 4+/5 4+/5 28.7/27.3  Shoulder internal rotation 4+/5 4+/5 15.8/15.9  Shoulder external rotation 4+/5 4+/5 16.3/14.0   (Blank rows = not tested)  1/4: pt reported tightness in right upper trap to create flexion press.      TODAY'S TREATMENT:  Treatment                            03/31/22:  Trigger Point Dry Needling, Manual Therapy Treatment:  Initial or subsequent education regarding Trigger Point Dry Needling: Subsequent Did patient give consent to treatment with Trigger Point Dry Needling: Yes TPDN with skilled palpation and monitoring followed by STM to the following muscles: bil upper traps, Lt rhomboids  Prone rib mobs Qped row+ER 5lb Qped horiz abd +trunk rotation 2lb    Treatment                            03/24/22:  Trigger Point Dry Needling, Manual Therapy Treatment:  Initial or subsequent education regarding Trigger Point Dry Needling: Subsequent Did patient give consent to treatment with  Trigger Point Dry Needling: Yes TPDN with skilled palpation and monitoring followed by STM to the following muscles: bil upper traps, left levator scap. Bil suboccpitials  Prone rib & thoracic mobs grade 3 with cavitation noted in mid-thoracic region Seated shoulder rolls & cervical AROM   1/25   Wand stretch overhead 3lbs x15  Open book stretch x5 5 sec hold billateral   Cable:  Row 2x15 15 lbs  Extensions 2x15 10 lbs  Chop x15 each side 5 lbs  Pallof press x15 5 lbs   Manual: trigger point release to upper trap ; sub-occipital release     PATIENT EDUCATION:  Education details: Educated pt on anatomy and physiology of current symptoms, FOTO, diagnosis, prognosis, HEP,  and POC. Person educated: Patient Education method: Customer service manager Education comprehension: verbalized understanding and returned demonstration   HOME EXERCISE PROGRAM: Access Code: [email or web address] URL: [email or web address]    ASSESSMENT:   CLINICAL IMPRESSION:  Excellent twitch response to dry needling in bilateral upper traps.  Patient is making a conscious effort to improve posture and it is notable upon arrival with posture presenting with reduced shoulder elevation.  Added exercises for periscapular musculature to encourage retraction and depression.  We did discuss the use of a posture support brace which patient will consider.  OBJECTIVE IMPAIRMENTS: decreased activity tolerance, decreased shoulder mobility, decreased strength, impaired flexibility, impaired UE use, postural dysfunction, and pain.   ACTIVITY LIMITATIONS: reaching, lifting, carry,  cleaning, driving, and or occupation   PERSONAL FACTORS: also affecting patient's functional outcome.        GOALS: Short term PT Goals Target date: 01/27/2022 Pt will be I and  compliant with HEP. Baseline:  Goal status: achieved   Long term PT goals Target date: 02/17/2022 Pt will report no headaches for a week duration   Baseline: have not had to lay down in a dark room for about 3 weeks.  Goal status: achieved Pt will improve  Rt shoulder strength to at least 5/5 MMT to improve functional strength Baseline:  Goal status: achieved Pt will improve FOTO to at least 69% functional to show improved function Baseline: 68 on 1/18 Goal status: New Pt will reduce pain to overall less than <1/10 with usual activity and work activity. Baseline: 3/10 Goal status: ongoing       5. Pt will demonstrate good posture and ergonomic posture when working.  Baseline: I am more cognisant of my posture, noted that she still sits forward over pelvis Goal status:     PLAN: PT FREQUENCY: 1x per week    PT DURATION: 6 weeks   PLANNED INTERVENTIONS (unless contraindicated): aquatic PT, Canalith repositioning, cryotherapy, Electrical stimulation, Iontophoresis with 4 mg/ml dexamethasome, Moist heat, traction, Ultrasound, gait training, Therapeutic exercise, balance training, neuromuscular re-education, patient/family education, prosthetic training, manual techniques, passive ROM, dry needling, taping, vasopnuematic device, vestibular, spinal manipulations, joint manipulations   PLAN FOR NEXT SESSION: Continue dry needling as needed, gross periscapular strength and endurance for functional postural support  Kania Regnier C. Claborn Janusz PT, DPT 03/31/22 7:16 PM

## 2022-04-05 ENCOUNTER — Ambulatory Visit: Payer: BC Managed Care – PPO | Admitting: Nurse Practitioner

## 2022-04-05 ENCOUNTER — Other Ambulatory Visit (HOSPITAL_COMMUNITY): Payer: Self-pay

## 2022-04-05 ENCOUNTER — Encounter: Payer: Self-pay | Admitting: Nurse Practitioner

## 2022-04-05 VITALS — BP 110/70 | HR 76 | Ht 61.5 in | Wt 150.8 lb

## 2022-04-05 DIAGNOSIS — F419 Anxiety disorder, unspecified: Secondary | ICD-10-CM

## 2022-04-05 DIAGNOSIS — N62 Hypertrophy of breast: Secondary | ICD-10-CM

## 2022-04-05 DIAGNOSIS — M542 Cervicalgia: Secondary | ICD-10-CM | POA: Insufficient documentation

## 2022-04-05 DIAGNOSIS — Z0000 Encounter for general adult medical examination without abnormal findings: Secondary | ICD-10-CM

## 2022-04-05 DIAGNOSIS — G43019 Migraine without aura, intractable, without status migrainosus: Secondary | ICD-10-CM | POA: Diagnosis not present

## 2022-04-05 DIAGNOSIS — E559 Vitamin D deficiency, unspecified: Secondary | ICD-10-CM

## 2022-04-05 MED ORDER — ESCITALOPRAM OXALATE 10 MG PO TABS
10.0000 mg | ORAL_TABLET | Freq: Every day | ORAL | 3 refills | Status: AC
  Filled 2022-04-05: qty 90, 90d supply, fill #0
  Filled 2022-07-29: qty 90, 90d supply, fill #1

## 2022-04-05 NOTE — Patient Instructions (Signed)
[place],  1. You will receive a referral for a consultation with Dr. Audelia Hives regarding potential breast reduction surgery. Her office will reach out to schedule an appointment, where you can also discuss insurance coverage.  2. Your prescription for Lexapro has been sent to [facility] as per your request.   3. Please continue to follow with GYN for monitoring of your cyst and report any changes or discomfort to Dr. Julien Girt. Additionally, keep an eye on your swallowing difficulties and seek further evaluation if the condition persists or worsens.  4. Maintain your current dosage of Lexapro, which is beneficial for your seasonal affective disorder. Should there be any changes in your mood or if symptoms worsen, do not hesitate to contact our office.  5. It is positive to hear that your migraines have decreased with physical therapy. Stick with your current management plan and inform us of any significant changes in the frequency or intensity of your migraines.  Should you have any questions or need further clarification on these instructions, please feel free to reach out. Our team is here to assist you and ensure you receive the highest standard of care.

## 2022-04-05 NOTE — Progress Notes (Signed)
Monique Keeler, Monique Ellison, Monique Ellison Monique Ellison, Monique Ellison [id number] Main Office [phone]  BP 110/70   Pulse 76   Ht 5' 1.5" (1.562 m)   Wt 150 lb 12.8 oz (68.4 kg)   LMP  (Exact Date) Comment: does not have period on her OCP's  BMI 28.03 kg/m    Subjective:    Patient ID: [name], female    DOB: [birth year], 38 y.o.   MRN: [id number]  HPI: LATIFAH YODERS is a 37 y.o. female presenting on 04/05/2022 for comprehensive medical examination.   Genieva presents today for CPE. She reports working in a high-stress environment. She enjoys her work, but there are some staffing issues that are difficult to navigate at times.    Additionally, she experiences difficulty swallowing, which she suspects may be due to esophageal strictures, and manages this by eating slowly and thoroughly chewing her food. Rice, bread, and similar foods that clump together seem to be the worst. She does not feel further evaluation is needed right now.   She has been diagnosed with a 6 cm benign ovarian cyst by her OBGYN, Dr. Julien Girt. Dura states that the cyst is not causing her any discomfort and, as a result, has been prescribed a different birth control. She denies any changes in bowel habits and reports that her menstrual periods are normal.  Regarding her mental health, Kysa's mood is stable, but she notes that she believes she suffers from seasonal affective disorder. She is currently hesitant to adjust her Lexapro dosage due to previous side effects.   Her migraines, which have been a concern in the past, have decreased in frequency since she began physical therapy for neck pain, upper back pain, and headaches. She is still having headaches, however, and she feels that this may be due to the increased weight from her breasts on her back and neck. Gardenia is also considering a breast reduction due to increased weight gain and the associated discomfort. She  inquires about insurance coverage for the procedure and expresses interest in obtaining a referral to plastic surgery for a consultation.  IMMUNIZATIONS:   Flu: Flu vaccine completed elsewhere this season Prevnar 13: Prevnar 13 N/A for this patient Prevnar 20: Prevnar 20 N/A for this patient Pneumovax 23: Pneumovax 23 N/A for this patient Vac Shingrix: Shingrix N/A for this patient HPV: HPV N/A for this patient Tetanus: Tetanus completed in the last 10 years  HEALTH MAINTENANCE: Pap Smear HM Status: is up to date- Dr. Julien Girt Mammogram HM Status: is not applicable for this patient Colon Cancer Screening HM Status: is not applicable for this patient Bone Density HM Status: is not applicable for this patient STI Testing HM Status: is not applicable for this patient  She reports regular vision exams q1-5y: Yes  She reports regular dental exams q 62m  Yes  The patient eats a regular, healthy diet. She endorses exercise and/or activity of:  routine  She currently: Marital Status: married Living situation: with family Sexual: monogamous Occupation: Nursing   Pertinent items are noted in HPI.  Most Recent Depression Screen:     04/05/2022    1:36 PM 09/13/2021    2:34 PM 03/04/2021    8:32 AM 10/02/2018    8:34 AM 08/16/2017    1:46 PM  Depression screen PHQ 2/9  Decreased Interest 1 0 0 0 0  Down, Depressed, Hopeless 1 0 0 0 0  PHQ - 2 Score 2 0 0 0 0  Altered sleeping 0 0   1  Tired, decreased energy 1 0   1  Change in appetite 1 0   0  Feeling bad or failure about yourself  0 0   0  Trouble concentrating 1 0   0  Moving slowly or fidgety/restless 0 0   0  Suicidal thoughts 0 0   0  PHQ-9 Score 5 0   2  Difficult doing work/chores Not difficult at all Not difficult at all   Not difficult at all   Most Recent Anxiety Screen:     04/05/2022    1:35 PM 03/04/2021    8:33 AM 10/02/2018    8:34 AM 08/16/2017    1:46 PM  GAD 7 : Generalized Anxiety Score  Nervous, Anxious, on  Edge 1 1 1 1  $ Control/stop worrying 0 0 1 0  Worry too much - different things 0 0 0 0  Trouble relaxing 1 0 1 0  Restless 0 0 0 0  Easily annoyed or irritable 0 1 0 0  Afraid - awful might happen 0 0 0 0  Total GAD 7 Score 2 2 3 1  $ Anxiety Difficulty Not difficult at all Somewhat difficult Not difficult at all Not difficult at all   Most Recent Fall Screen:    04/05/2022    1:35 PM 09/13/2021    2:33 PM 03/04/2021    8:31 AM  [place] in the past year? 0 0 0  Number falls in past yr: 0 0 0  Injury with Fall? 0 0 0  Risk for fall due to : No Fall Risks No Fall Risks No Fall Risks  Follow up Falls evaluation completed Falls evaluation completed Falls evaluation completed    Past medical history, surgical history, medications, allergies, family history and social history reviewed with patient today and changes made to appropriate areas of the chart.  Past Medical History:  Past Medical History:  Diagnosis Date   Family history of heart disease 05/29/2015   Father had MI in 50's   Former smoker 05/29/2015   Quit 2008   GERD (gastroesophageal reflux disease) 05/29/2015   Seasonal allergies    Medications:  Current Outpatient Medications on File Prior to Visit  Medication Sig   loratadine (CLARITIN) 10 MG tablet Take 1 tablet (10 mg total) by mouth daily.   Norethindrone Acetate-Ethinyl Estrad-FE (LOESTRIN 24 FE) 1-20 MG-MCG(24) tablet Take 1 tablet by mouth daily   rizatriptan (MAXALT) 10 MG tablet Take 1 tablet (10 mg total) by mouth as needed for migraine. May repeat once in 2 hours if needed. Do not take more than 2 doses in 24 hours. (Patient not taking: Reported on 04/05/2022)   No current facility-administered medications on file prior to visit.   Surgical History:  Past Surgical History:  Procedure Laterality Date   WISDOM TOOTH EXTRACTION     Allergies:  No Known Allergies Family History:  Family History  Problem Relation Age of Onset   Diabetes Mother     Hyperlipidemia Mother    Stroke Mother    Hypertension Father    Stroke Father    Heart attack Father    Depression Brother    Diabetes Maternal Grandmother        Objective:    BP 110/70   Pulse 76   Ht 5' 1.5" (1.562 m)   Wt 150 lb 12.8 oz (68.4 kg)   LMP  (Exact Date) Comment: does not have  period on her OCP's  BMI 28.03 kg/m   Wt Readings from Last 3 Encounters:  04/05/22 150 lb 12.8 oz (68.4 kg)  09/13/21 152 lb 1.6 oz (69 kg)  03/04/21 149 lb 6.4 oz (67.8 kg)    Physical Exam Vitals and nursing note reviewed.  Constitutional:      General: She is not in acute distress.    Appearance: Normal appearance.  HENT:     Head: Normocephalic and atraumatic.     Right Ear: Hearing, tympanic membrane, ear canal and external ear normal.     Left Ear: Hearing, tympanic membrane, ear canal and external ear normal.     Nose: Nose normal.     Right Sinus: No maxillary sinus tenderness or frontal sinus tenderness.     Left Sinus: No maxillary sinus tenderness or frontal sinus tenderness.     Mouth/Throat:     Lips: Pink.     Mouth: Mucous membranes are moist.     Pharynx: Oropharynx is clear.  Eyes:     General: Lids are normal. Vision grossly intact.     Extraocular Movements: Extraocular movements intact.     Conjunctiva/sclera: Conjunctivae normal.     Pupils: Pupils are equal, round, and reactive to light.     Funduscopic exam:    Right eye: Red reflex present.        Left eye: Red reflex present.    Visual Fields: Right eye visual fields normal and left eye visual fields normal.  Neck:     Thyroid: No thyromegaly.     Vascular: No carotid bruit.  Cardiovascular:     Rate and Rhythm: Normal rate and regular rhythm.     Chest Wall: PMI is not displaced.     Pulses: Normal pulses.          Dorsalis pedis pulses are 2+ on the right side and 2+ on the left side.       Posterior tibial pulses are 2+ on the right side and 2+ on the left side.     Heart sounds: Normal  heart sounds. No murmur heard. Pulmonary:     Effort: Pulmonary effort is normal. No respiratory distress.     Breath sounds: Normal breath sounds.  Abdominal:     General: Abdomen is flat. Bowel sounds are normal. There is no distension.     Palpations: Abdomen is soft. There is no hepatomegaly, splenomegaly or mass.     Tenderness: There is no abdominal tenderness. There is no right CVA tenderness, left CVA tenderness, guarding or rebound.  Musculoskeletal:        General: Normal range of motion.     Cervical back: Full passive range of motion without pain, normal range of motion and neck supple. No tenderness.     Right lower leg: No edema.     Left lower leg: No edema.  Feet:     Left foot:     Toenail Condition: Left toenails are normal.  Lymphadenopathy:     Cervical: No cervical adenopathy.     Upper Body:     Right upper body: No supraclavicular adenopathy.     Left upper body: No supraclavicular adenopathy.  Skin:    General: Skin is warm and dry.     Capillary Refill: Capillary refill takes less than 2 seconds.     Nails: There is no clubbing.  Neurological:     General: No focal deficit present.     Mental Status: She is  alert and oriented to person, place, and time.     GCS: GCS eye subscore is 4. GCS verbal subscore is 5. GCS motor subscore is 6.     Sensory: Sensation is intact.     Motor: Motor function is intact.     Coordination: Coordination is intact.     Gait: Gait is intact.     Deep Tendon Reflexes: Reflexes are normal and symmetric.  Psychiatric:        Attention and Perception: Attention normal.        Mood and Affect: Mood normal.        Speech: Speech normal.        Behavior: Behavior normal. Behavior is cooperative.        Thought Content: Thought content normal.        Cognition and Memory: Cognition and memory normal.        Judgment: Judgment normal.     Results for orders placed or performed in visit on 11/11/19  CBC  Result Value Ref  Range   WBC 6.2 3.8 - 10.8 Thousand/uL   RBC 4.59 3.80 - 5.10 Million/uL   Hemoglobin 13.4 11.7 - 15.5 g/dL   HCT 40.2 35.0 - 45.0 %   MCV 87.6 80.0 - 100.0 fL   MCH 29.2 27.0 - 33.0 pg   MCHC 33.3 32.0 - 36.0 g/dL   RDW 12.4 11.0 - 15.0 %   Platelets 232 140 - 400 Thousand/uL   MPV 10.5 7.5 - 12.5 fL  COMPLETE METABOLIC PANEL WITH GFR  Result Value Ref Range   Glucose, Bld 87 65 - 139 mg/dL   BUN 10 7 - 25 mg/dL   Creat 0.72 0.50 - 1.10 mg/dL   GFR, Est Non African American 111 > OR = 60 mL/min/1.64m   GFR, Est African American 128 > OR = 60 mL/min/1.752m  BUN/Creatinine Ratio NOT APPLICABLE 6 - 22 (calc)   Sodium 138 135 - 146 mmol/L   Potassium 4.2 3.5 - 5.3 mmol/L   Chloride 103 98 - 110 mmol/L   CO2 27 20 - 32 mmol/L   Calcium 9.4 8.6 - 10.2 mg/dL   Total Protein 6.3 6.1 - 8.1 g/dL   Albumin 4.3 3.6 - 5.1 g/dL   Globulin 2.0 1.9 - 3.7 g/dL (calc)   AG Ratio 2.2 1.0 - 2.5 (calc)   Total Bilirubin 0.5 0.2 - 1.2 mg/dL   Alkaline phosphatase (APISO) 55 31 - 125 U/L   AST 13 10 - 30 U/L   ALT 11 6 - 29 U/L  Lipid panel  Result Value Ref Range   Cholesterol 216 (H) <200 mg/dL   HDL 70 > OR = 50 mg/dL   Triglycerides 117 <150 mg/dL   LDL Cholesterol (Calc) 124 (H) mg/dL (calc)   Total CHOL/HDL Ratio 3.1 <5.0 (calc)   Non-HDL Cholesterol (Calc) 146 (H) <130 mg/dL (calc)         Assessment & Plan:   Problem List Items Addressed This Visit     Anxiety    Currently well controlled on lexapro 1015mWe did discuss the option of increasing the dose to help with seasonal changes in mood, but she would like to hold off on this.  Plan: Refills provided for lexapro 44m19mr 1 year Let me know if you mood symptoms don't seem to improve as spring approaches.       Relevant Medications   escitalopram (LEXAPRO) 10 MG tablet   Neck pain  Relevant Orders   Ambulatory referral to Plastic Surgery   Large breasts    Macromastia with neck pain and tension. She expresses  concerns that her breast size is contributing to ongoing upper back and neck pain and migraine headaches. These have improved some with PT. Plan: Referral to plastic surgery for consultation about possible augmentation/reduction to help with migraines.       Relevant Orders   Ambulatory referral to Plastic Surgery   Intractable migraine without aura and without status migrainosus    Chronic migraine headaches associated with neck and shoulder tension. Currently undergoing PT for her neck, which is helping. Suspects that symptoms could also be exacerbated by large breast size.  Plan: Continue with PT for pain Use migraine medication as needed Referral has been placed to Dr. Eusebio Friendly office for plastic surgery consultation for your breasts.       Relevant Medications   escitalopram (LEXAPRO) 10 MG tablet   Other Relevant Orders   Ambulatory referral to Plastic Surgery   Encounter for annual physical exam - Primary    CPE today with no abnormalities noted on exam.  Labs pending. Will make changes as necessary based on results.  Review of HM activities and recommendations discussed and provided on AVS Anticipatory guidance, diet, and exercise recommendations provided.  Medications, allergies, and hx reviewed and updated as necessary.  Plan to f/u with CPE in 1 year or sooner for acute/chronic health needs as directed.        Relevant Orders   CBC with Differential/Platelet   Comprehensive metabolic panel   VITAMIN D 25 Hydroxy (Vit-D Deficiency, Fractures)   TSH   T4, free   Other Visit Diagnoses     Health care maintenance       Relevant Orders   CBC with Differential/Platelet   Comprehensive metabolic panel   VITAMIN D 25 Hydroxy (Vit-D Deficiency, Fractures)   TSH   T4, free          Follow up plan: Return in about 1 year (around 04/06/2023) for CPE.  NEXT PREVENTATIVE PHYSICAL DUE IN 1 YEAR.  PATIENT COUNSELING PROVIDED FOR ALL ADULT PATIENTS:  Consume a  well balanced diet low in saturated fats, cholesterol, and moderation in carbohydrates.   This can be as simple as monitoring portion sizes and cutting back on sugary beverages such as soda and juice to start with.    Daily water consumption of at least 64 ounces.  Physical activity at least 180 minutes per week, if just starting out.   This can be as simple as taking the stairs instead of the elevator and walking 2-3 laps around the office  purposefully every day.   STD protection, partner selection, and regular testing if high risk.  Limited consumption of alcoholic beverages if alcohol is consumed.  For women, I recommend no more than 7 alcoholic beverages per week, spread out throughout the week.  Avoid "binge" drinking or consuming large quantities of alcohol in one setting.   Please let me know if you feel you may need help with reduction or quitting alcohol consumption.   Avoidance of nicotine, if used.  Please let me know if you feel you may need help with reduction or quitting nicotine use.   Daily mental health attention.  This can be in the form of 5 minute daily meditation, prayer, journaling, yoga, reflection, etc.   Purposeful attention to your emotions and mental state can significantly improve your overall wellbeing and Health.  Please know  that I am here to help you with all of your health care goals and am happy to work with you to find a solution that works best for you.  The greatest advice I have received with any changes in life are to take it one step at a time, that even means if all you can focus on is the next 60 seconds, then do that and celebrate your victories.  With any changes in life, you will have set backs, and that is OK. The important thing to remember is, if you have a set back, it is not a failure, it is an opportunity to try again!  Health Maintenance Recommendations Screening Testing Mammogram Every 1 -2 years based on history and risk  factors Starting at age 46 Pap Smear Ages 21-39 every 3 years Ages 71-65 every 5 years with HPV testing More frequent testing may be required based on results and history Colon Cancer Screening Every 1-10 years based on test performed, risk factors, and history Starting at age 68 Bone Density Screening Every 2-10 years based on history Starting at age 39 for women Recommendations for men differ based on medication usage, history, and risk factors AAA Screening One time ultrasound Men 61-69 years old who have every smoked Lung Cancer Screening Low Dose Lung CT every 12 months Age 33-80 years with a 30 pack-year smoking history who still smoke or who have quit within the last 15 years  Screening Labs Routine  Labs: Complete Blood Count (CBC), Complete Metabolic Panel (CMP), Cholesterol (Lipid Panel) Every 6-12 months based on history and medications May be recommended more frequently based on current conditions or previous results Hemoglobin A1c Lab Every 3-12 months based on history and previous results Starting at age 37 or earlier with diagnosis of diabetes, high cholesterol, BMI >26, and/or risk factors Frequent monitoring for patients with diabetes to ensure blood sugar control Thyroid Panel (TSH w/ T3 & T4) Every 6 months based on history, symptoms, and risk factors May be repeated more often if on medication HIV One time testing for all patients 66 and older May be repeated more frequently for patients with increased risk factors or exposure Hepatitis C One time testing for all patients 72 and older May be repeated more frequently for patients with increased risk factors or exposure Gonorrhea, Chlamydia Every 12 months for all sexually active persons 13-24 years Additional monitoring may be recommended for those who are considered high risk or who have symptoms PSA Men 27-87 years old with risk factors Additional screening may be recommended from age 47-69 based on risk  factors, symptoms, and history  Vaccine Recommendations Tetanus Booster All adults every 10 years Flu Vaccine All patients 6 months and older every year COVID Vaccine All patients 12 years and older Initial dosing with booster May recommend additional booster based on age and health history HPV Vaccine 2 doses all patients age 28-26 Dosing may be considered for patients over 26 Shingles Vaccine (Shingrix) 2 doses all adults 70 years and older Pneumonia (Pneumovax 23) All adults 44 years and older May recommend earlier dosing based on health history Pneumonia (Prevnar 74) All adults 47 years and older Dosed 1 year after Pneumovax 23  Additional Screening, Testing, and Vaccinations may be recommended on an individualized basis based on family history, health history, risk factors, and/or exposure.

## 2022-04-05 NOTE — Assessment & Plan Note (Signed)
Macromastia with neck pain and tension. She expresses concerns that her breast size is contributing to ongoing upper back and neck pain and migraine headaches. These have improved some with PT. Plan: Referral to plastic surgery for consultation about possible augmentation/reduction to help with migraines.

## 2022-04-05 NOTE — Assessment & Plan Note (Signed)
Chronic migraine headaches associated with neck and shoulder tension. Currently undergoing PT for her neck, which is helping. Suspects that symptoms could also be exacerbated by large breast size.  Plan: Continue with PT for pain Use migraine medication as needed Referral has been placed to Dr. Eusebio Friendly office for plastic surgery consultation for your breasts.

## 2022-04-05 NOTE — Assessment & Plan Note (Signed)

## 2022-04-05 NOTE — Assessment & Plan Note (Signed)
Currently well controlled on lexapro 52m. We did discuss the option of increasing the dose to help with seasonal changes in mood, but she would like to hold off on this.  Plan: Refills provided for lexapro 131mfor 1 year Let me know if you mood symptoms don't seem to improve as spring approaches.

## 2022-04-06 LAB — CBC WITH DIFFERENTIAL/PLATELET
Basophils Absolute: 0.1 10*3/uL (ref 0.0–0.2)
Basos: 1 %
EOS (ABSOLUTE): 0.2 10*3/uL (ref 0.0–0.4)
Eos: 3 %
Hematocrit: 41.7 % (ref 34.0–46.6)
Hemoglobin: 14 g/dL (ref 11.1–15.9)
Immature Grans (Abs): 0 10*3/uL (ref 0.0–0.1)
Immature Granulocytes: 0 %
Lymphocytes Absolute: 2.4 10*3/uL (ref 0.7–3.1)
Lymphs: 39 %
MCH: 28.3 pg (ref 26.6–33.0)
MCHC: 33.6 g/dL (ref 31.5–35.7)
MCV: 84 fL (ref 79–97)
Monocytes Absolute: 0.4 10*3/uL (ref 0.1–0.9)
Monocytes: 7 %
Neutrophils Absolute: 3 10*3/uL (ref 1.4–7.0)
Neutrophils: 50 %
Platelets: 338 10*3/uL (ref 150–450)
RBC: 4.94 x10E6/uL (ref 3.77–5.28)
RDW: 13.1 % (ref 11.7–15.4)
WBC: 6.1 10*3/uL (ref 3.4–10.8)

## 2022-04-06 LAB — VITAMIN D 25 HYDROXY (VIT D DEFICIENCY, FRACTURES): Vit D, 25-Hydroxy: 22.7 ng/mL — ABNORMAL LOW (ref 30.0–100.0)

## 2022-04-06 LAB — COMPREHENSIVE METABOLIC PANEL
ALT: 13 IU/L (ref 0–32)
AST: 11 IU/L (ref 0–40)
Albumin/Globulin Ratio: 2 (ref 1.2–2.2)
Albumin: 4.5 g/dL (ref 3.9–4.9)
Alkaline Phosphatase: 87 IU/L (ref 44–121)
BUN/Creatinine Ratio: 12 (ref 9–23)
BUN: 8 mg/dL (ref 6–20)
Bilirubin Total: 0.3 mg/dL (ref 0.0–1.2)
CO2: 19 mmol/L — ABNORMAL LOW (ref 20–29)
Calcium: 9.7 mg/dL (ref 8.7–10.2)
Chloride: 103 mmol/L (ref 96–106)
Creatinine, Ser: 0.68 mg/dL (ref 0.57–1.00)
Globulin, Total: 2.3 g/dL (ref 1.5–4.5)
Glucose: 82 mg/dL (ref 70–99)
Potassium: 4.4 mmol/L (ref 3.5–5.2)
Sodium: 137 mmol/L (ref 134–144)
Total Protein: 6.8 g/dL (ref 6.0–8.5)
eGFR: 116 mL/min/{1.73_m2} (ref >59)

## 2022-04-06 LAB — TSH: TSH: 1.12 u[IU]/mL (ref 0.450–4.500)

## 2022-04-06 LAB — T4, FREE: Free T4: 1.03 ng/dL (ref 0.82–1.77)

## 2022-04-07 ENCOUNTER — Ambulatory Visit (HOSPITAL_BASED_OUTPATIENT_CLINIC_OR_DEPARTMENT_OTHER): Payer: BC Managed Care – PPO | Admitting: Physical Therapy

## 2022-04-11 DIAGNOSIS — F411 Generalized anxiety disorder: Secondary | ICD-10-CM | POA: Diagnosis not present

## 2022-04-11 DIAGNOSIS — F32A Depression, unspecified: Secondary | ICD-10-CM | POA: Diagnosis not present

## 2022-04-12 ENCOUNTER — Other Ambulatory Visit (HOSPITAL_COMMUNITY): Payer: Self-pay

## 2022-04-12 MED ORDER — VITAMIN D (ERGOCALCIFEROL) 1.25 MG (50000 UNIT) PO CAPS
50000.0000 [IU] | ORAL_CAPSULE | ORAL | 0 refills | Status: DC
Start: 2022-04-12 — End: 2022-09-23
  Filled 2022-04-12 – 2022-04-22 (×2): qty 12, 84d supply, fill #0

## 2022-04-12 NOTE — Addendum Note (Signed)
Addended by: Katieann Hungate, Clarise Cruz E on: 04/12/2022 07:04 AM   Modules accepted: Orders

## 2022-04-13 ENCOUNTER — Encounter: Payer: Self-pay | Admitting: Nurse Practitioner

## 2022-04-14 ENCOUNTER — Encounter (HOSPITAL_BASED_OUTPATIENT_CLINIC_OR_DEPARTMENT_OTHER): Payer: Self-pay | Admitting: Physical Therapy

## 2022-04-14 ENCOUNTER — Ambulatory Visit (HOSPITAL_BASED_OUTPATIENT_CLINIC_OR_DEPARTMENT_OTHER): Payer: BC Managed Care – PPO | Admitting: Physical Therapy

## 2022-04-14 DIAGNOSIS — R42 Dizziness and giddiness: Secondary | ICD-10-CM | POA: Diagnosis not present

## 2022-04-14 DIAGNOSIS — M542 Cervicalgia: Secondary | ICD-10-CM

## 2022-04-14 DIAGNOSIS — R252 Cramp and spasm: Secondary | ICD-10-CM

## 2022-04-14 DIAGNOSIS — R293 Abnormal posture: Secondary | ICD-10-CM | POA: Diagnosis not present

## 2022-04-14 NOTE — Therapy (Incomplete)
OUTPATIENT PHYSICAL THERAPY TREATMENT NOTE/discharge    Patient Name: Monique Ellison MRN: [id number] DOB:[birth year], 37 y.o., female Today's Date: 03/31/2022  PCP:  Orma Render, NP  REFERRING PROVIDER:  Orma Render, NP   END OF SESSION:   PT End of Session - 03/31/22 1442     Visit Number 11    Number of Visits 14    Date for PT Re-Evaluation 04/16/21    Authorization Type BCBS    PT Start Time 1442    PT Stop Time 1521    PT Time Calculation (min) 39 min    Activity Tolerance Patient tolerated treatment well    Behavior During Therapy [facility] for tasks assessed/performed                Past Medical History:  Diagnosis Date   Family history of heart disease 05/29/2015   Father had MI in 18's   Former smoker 05/29/2015   Quit 2008   GERD (gastroesophageal reflux disease) 05/29/2015   Seasonal allergies    Past Surgical History:  Procedure Laterality Date   WISDOM TOOTH EXTRACTION     Patient Active Problem List   Diagnosis Date Noted   Dizziness 09/13/2021   Abnormal cervical Papanicolaou smear 02/07/2020   Family history of heart disease 08/16/2017   Anxiety 02/23/2017   Panic attack 02/20/2017   Tension headache 08/22/2016   GERD (gastroesophageal reflux disease) 05/29/2015   Former smoker 05/29/2015    REFERRING DIAG: Intractable migraine without aura and without status migrainosus [G43.019]    THERAPY DIAG:  Abnormal posture  Cervicalgia  Rationale for Evaluation and Treatment Rehabilitation  PERTINENT HISTORY: None  PRECAUTIONS: None  SUBJECTIVE:                                                                                                                                                                                      SUBJECTIVE STATEMENT:   Migraine symptoms are resolved. Overall she is just having some tightness in her neck.   PAIN:  Are you having pain? Yes: NPRS scale: 0      Pain location: Rt post shoulder  Pain description:  Pin point pain in her R UT.  Aggravating factors: Sleeping wrong.  Relieving factors: Dry needling, tennis ball on knot, heat.  OBJECTIVE:    DIAGNOSTIC FINDINGS:  None   PATIENT SURVEYS:  FOTO 65.51%, 69% in 10 visits FOTO 02/24/22: 61     POSTURE: 1/4: keeps shoulders back but does bend forward ahead of pelvis, bil winging scapula   PALPATION: R UT       CERVICAL ROM:    Active ROM A/PROM (deg) eval  Flexion WFL  Extension WFL  Right lateral flexion WFL  Left lateral flexion WFL  Right rotation WFL  Left rotation WFL   (Blank rows = not tested)   UPPER EXTREMITY ROM:   Active ROM Right eval Left eval  Shoulder flexion [facility]  Shoulder abduction      Shoulder internal rotation [facility]  Shoulder external rotation WFL WFL   (Blank rows = not tested)   UPPER EXTREMITY MMT:   MMT Right eval Left eval Rt/Lt 1/4 (lb)  Shoulder flexion 4+/5 4+/5 28.7/27.3  Shoulder internal rotation 4+/5 4+/5 15.8/15.9  Shoulder external rotation 4+/5 4+/5 16.3/14.0   (Blank rows = not tested)  1/4: pt reported tightness in right upper trap to create flexion press.      TODAY'S TREATMENT: 2/22  Treatment                            03/31/22:  Trigger Point Dry Needling, Manual Therapy Treatment:  Initial or subsequent education regarding Trigger Point Dry Needling: Subsequent Did patient give consent to treatment with Trigger Point Dry Needling: Yes TPDN with skilled palpation and monitoring followed by STM to the following muscles: bil upper traps, Lt rhomboids  Prone rib mobs Qped row+ER 5lb Qped horiz abd +trunk rotation 2lb    Treatment                            03/24/22:  Trigger Point Dry Needling, Manual Therapy Treatment:  Initial or subsequent education regarding Trigger Point Dry Needling: Subsequent Did patient give consent to treatment with Trigger Point Dry Needling: Yes TPDN with skilled palpation and monitoring followed by STM to the following muscles:  bil upper traps, left levator scap. Bil suboccpitials  Prone rib & thoracic mobs grade 3 with cavitation noted in mid-thoracic region Seated shoulder rolls & cervical AROM   1/25   Wand stretch overhead 3lbs x15  Open book stretch x5 5 sec hold billateral   Cable:  Row 2x15 15 lbs  Extensions 2x15 10 lbs  Chop x15 each side 5 lbs  Pallof press x15 5 lbs   Manual: trigger point release to upper trap ; sub-occipital release     PATIENT EDUCATION:  Education details: Educated pt on anatomy and physiology of current symptoms, FOTO, diagnosis, prognosis, HEP,  and POC. Person educated: Patient Education method: Customer service manager Education comprehension: verbalized understanding and returned demonstration   HOME EXERCISE PROGRAM: Access Code: [email or web address] URL: [email or web address]    ASSESSMENT:   CLINICAL IMPRESSION:  Excellent twitch response to dry needling in bilateral upper traps.  Patient is making a conscious effort to improve posture and it is notable upon arrival with posture presenting with reduced shoulder elevation.  Added exercises for periscapular musculature to encourage retraction and depression.  We did discuss the use of a posture support brace which patient will consider.  OBJECTIVE IMPAIRMENTS: decreased activity tolerance, decreased shoulder mobility, decreased strength, impaired flexibility, impaired UE use, postural dysfunction, and pain.   ACTIVITY LIMITATIONS: reaching, lifting, carry,  cleaning, driving, and or occupation   PERSONAL FACTORS: also affecting patient's functional outcome.        GOALS: Short term PT Goals Target date: 01/27/2022 Pt will be I and compliant with HEP. Baseline:  Goal status: achieved   Long term PT goals Target date: 02/17/2022 Pt will report no headaches for a week duration  Baseline: have  not had to lay down in a dark room for about 3 weeks.  Goal status: achieved Pt will improve  Rt  shoulder strength to at least 5/5 MMT to improve functional strength Baseline:  Goal status: achieved Pt will improve FOTO to at least 69% functional to show improved function Baseline: 68 on 1/18 Goal status: New Pt will reduce pain to overall less than <1/10 with usual activity and work activity. Baseline: 3/10 Goal status: ongoing       5. Pt will demonstrate good posture and ergonomic posture when working.  Baseline: I am more cognisant of my posture, noted that she still sits forward over pelvis Goal status:     PLAN: PT FREQUENCY: 1x per week    PT DURATION: 6 weeks   PLANNED INTERVENTIONS (unless contraindicated): aquatic PT, Canalith repositioning, cryotherapy, Electrical stimulation, Iontophoresis with 4 mg/ml dexamethasome, Moist heat, traction, Ultrasound, gait training, Therapeutic exercise, balance training, neuromuscular re-education, patient/family education, prosthetic training, manual techniques, passive ROM, dry needling, taping, vasopnuematic device, vestibular, spinal manipulations, joint manipulations   PLAN FOR NEXT SESSION: Continue dry needling as needed, gross periscapular strength and endurance for functional postural support  Jessica C. Hightower PT, DPT 03/31/22 7:16 PM

## 2022-04-15 NOTE — Therapy (Signed)
OUTPATIENT PHYSICAL THERAPY TREATMENT NOTE/discharge    Patient Name: Monique Ellison MRN: [id number] DOB:07/31/1985, 37 y.o., female Today's Date: 04/15/2022  PCP:  Orma Render, NP  REFERRING PROVIDER:  Orma Render, NP   END OF SESSION:   PT End of Session - 04/14/22 1545     Visit Number 12    Number of Visits 14    Date for PT Re-Evaluation 04/16/21    Authorization Type BCBS    PT Start Time 4    PT Stop Time 1505   did not require a full session for D/C visit   PT Time Calculation (min) 35 min    Activity Tolerance Patient tolerated treatment well    Behavior During Therapy [facility] for tasks assessed/performed                Past Medical History:  Diagnosis Date   Family history of heart disease 05/29/2015   Father had MI in 16's   Former smoker 05/29/2015   Quit 2008   GERD (gastroesophageal reflux disease) 05/29/2015   Seasonal allergies    Past Surgical History:  Procedure Laterality Date   WISDOM TOOTH EXTRACTION     Patient Active Problem List   Diagnosis Date Noted   Neck pain 04/05/2022   Large breasts 04/05/2022   Intractable migraine without aura and without status migrainosus 04/05/2022   Encounter for annual physical exam 04/05/2022   Dizziness 09/13/2021   Abnormal cervical Papanicolaou smear 02/07/2020   Family history of heart disease 08/16/2017   Anxiety 02/23/2017   Panic attack 02/20/2017   Tension headache 08/22/2016   GERD (gastroesophageal reflux disease) 05/29/2015   Former smoker 05/29/2015    REFERRING DIAG: Intractable migraine without aura and without status migrainosus [G43.019]    THERAPY DIAG:  Abnormal posture  Cervicalgia  Cramp and spasm  Rationale for Evaluation and Treatment Rehabilitation  PERTINENT HISTORY: None  PRECAUTIONS: None  SUBJECTIVE:                                                                                                                                                                                       SUBJECTIVE STATEMENT:   Migraine symptoms are resolved. Overall she is just having some tightness in her neck.   PAIN:  Are you having pain? Yes: NPRS scale: 0      Pain location: Rt post shoulder  Pain description: Pin point pain in her R UT.  Aggravating factors: Sleeping wrong.  Relieving factors: Dry needling, tennis ball on knot, heat.  OBJECTIVE:    DIAGNOSTIC FINDINGS:  None   PATIENT SURVEYS:  FOTO 65.51%, 69% in 10 visits  FOTO 02/24/22: 61     POSTURE: 1/4: keeps shoulders back but does bend forward ahead of pelvis, bil winging scapula   PALPATION: R UT       CERVICAL ROM:    Active ROM A/PROM (deg) eval  Flexion WFL  Extension WFL  Right lateral flexion WFL  Left lateral flexion WFL  Right rotation WFL  Left rotation WFL   (Blank rows = not tested)   UPPER EXTREMITY ROM:   Active ROM Right eval Left eval  Shoulder flexion [facility]  Shoulder abduction      Shoulder internal rotation [facility]  Shoulder external rotation WFL WFL   (Blank rows = not tested)   UPPER EXTREMITY MMT:   MMT Right eval Left eval Rt/Lt 1/4 (lb)  Shoulder flexion 4+/5 4+/5 28.7/27.3  Shoulder internal rotation 4+/5 4+/5 15.8/15.9  Shoulder external rotation 4+/5 4+/5 16.3/14.0   (Blank rows = not tested)  1/4: pt reported tightness in right upper trap to create flexion press.      TODAY'S TREATMENT: 2/22 Trigger Point Dry Needling, Manual Therapy Treatment:  Initial or subsequent education regarding Trigger Point Dry Needling: Subsequent Did patient give consent to treatment with Trigger Point Dry Needling: Yes TPDN with skilled palpation and monitoring followed by STM to the following muscles: bil upper traps, left levator; left sub-occiptiasl  Manual: Trigger point release to muscle groups needled.   Treatment                            03/31/22:  Trigger Point Dry Needling, Manual Therapy Treatment:  Initial or subsequent education regarding  Trigger Point Dry Needling: Subsequent Did patient give consent to treatment with Trigger Point Dry Needling: Yes TPDN with skilled palpation and monitoring followed by STM to the following muscles: bil upper traps, Lt rhomboids  Prone rib mobs Qped row+ER 5lb Qped horiz abd +trunk rotation 2lb    Treatment                            03/24/22:  Trigger Point Dry Needling, Manual Therapy Treatment:  Initial or subsequent education regarding Trigger Point Dry Needling: Subsequent Did patient give consent to treatment with Trigger Point Dry Needling: Yes TPDN with skilled palpation and monitoring followed by STM to the following muscles: bil upper traps, left levator scap. Bil suboccpitials  Prone rib & thoracic mobs grade 3 with cavitation noted in mid-thoracic region Seated shoulder rolls & cervical AROM   1/25   Wand stretch overhead 3lbs x15  Open book stretch x5 5 sec hold billateral   Cable:  Row 2x15 15 lbs  Extensions 2x15 10 lbs  Chop x15 each side 5 lbs  Pallof press x15 5 lbs   Manual: trigger point release to upper trap ; sub-occipital release     PATIENT EDUCATION:  Education details: Educated pt on anatomy and physiology of current symptoms, FOTO, diagnosis, prognosis, HEP,  and POC. Person educated: Patient Education method: Customer service manager Education comprehension: verbalized understanding and returned demonstration   HOME EXERCISE PROGRAM: Access Code: [email or web address] URL: [email or web address]    ASSESSMENT:   CLINICAL IMPRESSION:  Overall the patient has made great progress. She still has some tightness, but mostly she is doing well. She has met most of her goals. We performed TPDN today and reviewed self soft tissue mobilization. We will D/C to HEP at this time.  OBJECTIVE IMPAIRMENTS:  decreased activity tolerance, decreased shoulder mobility, decreased strength, impaired flexibility, impaired UE use, postural dysfunction, and  pain.   ACTIVITY LIMITATIONS: reaching, lifting, carry,  cleaning, driving, and or occupation   PERSONAL FACTORS: also affecting patient's functional outcome.        GOALS: Short term PT Goals Target date: 01/27/2022 Pt will be I and compliant with HEP. Baseline:  Goal status: achieved   Long term PT goals Target date: 02/17/2022 Pt will report no headaches for a week duration  Baseline: have not had to lay down in a dark room for about 3 weeks.  Goal status: achieved Pt will improve  Rt shoulder strength to at least 5/5 MMT to improve functional strength Baseline:  Goal status: achieved 2/22  Pt will improve FOTO to at least 69% functional to show improved function Baseline: 68 on 1/18 Goal status: New Pt will reduce pain to overall less than <1/10 with usual activity and work activity. Baseline:  achieved 2/22  Goal status: ongoing       5. Pt will demonstrate good posture and ergonomic posture when working.  Baseline: I am more cognisant of my posture, noted that she still sits forward over pelvis Goal status:     PLAN: PT FREQUENCY: 1x per week    PT DURATION: 6 weeks   PLANNED INTERVENTIONS (unless contraindicated): aquatic PT, Canalith repositioning, cryotherapy, Electrical stimulation, Iontophoresis with 4 mg/ml dexamethasome, Moist heat, traction, Ultrasound, gait training, Therapeutic exercise, balance training, neuromuscular re-education, patient/family education, prosthetic training, manual techniques, passive ROM, dry needling, taping, vasopnuematic device, vestibular, spinal manipulations, joint manipulations   PLAN FOR NEXT SESSION: Continue dry needling as needed, gross periscapular strength and endurance for functional postural support  Jessica C. Hightower PT, DPT 04/15/22 9:31 AM

## 2022-04-21 ENCOUNTER — Other Ambulatory Visit (HOSPITAL_COMMUNITY): Payer: Self-pay

## 2022-04-22 ENCOUNTER — Other Ambulatory Visit (HOSPITAL_COMMUNITY): Payer: Self-pay

## 2022-04-22 DIAGNOSIS — Z6827 Body mass index (BMI) 27.0-27.9, adult: Secondary | ICD-10-CM | POA: Diagnosis not present

## 2022-04-22 DIAGNOSIS — R319 Hematuria, unspecified: Secondary | ICD-10-CM | POA: Diagnosis not present

## 2022-04-22 DIAGNOSIS — Z01419 Encounter for gynecological examination (general) (routine) without abnormal findings: Secondary | ICD-10-CM | POA: Diagnosis not present

## 2022-04-22 MED ORDER — BLISOVI 24 FE 1-20 MG-MCG(24) PO TABS
ORAL_TABLET | ORAL | 4 refills | Status: DC
  Filled 2022-04-22 – 2022-06-06 (×2): qty 84, 84d supply, fill #0
  Filled 2022-09-01: qty 84, 84d supply, fill #1

## 2022-05-05 DIAGNOSIS — F32A Depression, unspecified: Secondary | ICD-10-CM | POA: Diagnosis not present

## 2022-05-05 DIAGNOSIS — F411 Generalized anxiety disorder: Secondary | ICD-10-CM | POA: Diagnosis not present

## 2022-05-19 DIAGNOSIS — F411 Generalized anxiety disorder: Secondary | ICD-10-CM | POA: Diagnosis not present

## 2022-05-19 DIAGNOSIS — F32A Depression, unspecified: Secondary | ICD-10-CM | POA: Diagnosis not present

## 2022-05-27 DIAGNOSIS — F32A Depression, unspecified: Secondary | ICD-10-CM | POA: Diagnosis not present

## 2022-05-27 DIAGNOSIS — F411 Generalized anxiety disorder: Secondary | ICD-10-CM | POA: Diagnosis not present

## 2022-05-30 ENCOUNTER — Ambulatory Visit: Payer: BC Managed Care – PPO | Admitting: Plastic Surgery

## 2022-05-30 ENCOUNTER — Encounter: Payer: Self-pay | Admitting: Plastic Surgery

## 2022-05-30 VITALS — BP 123/76 | HR 82 | Ht 61.0 in | Wt 150.6 lb

## 2022-05-30 DIAGNOSIS — N62 Hypertrophy of breast: Secondary | ICD-10-CM

## 2022-05-30 DIAGNOSIS — M545 Low back pain, unspecified: Secondary | ICD-10-CM

## 2022-05-30 DIAGNOSIS — M546 Pain in thoracic spine: Secondary | ICD-10-CM

## 2022-05-30 DIAGNOSIS — M549 Dorsalgia, unspecified: Secondary | ICD-10-CM | POA: Insufficient documentation

## 2022-05-30 DIAGNOSIS — Z803 Family history of malignant neoplasm of breast: Secondary | ICD-10-CM

## 2022-05-30 DIAGNOSIS — G8929 Other chronic pain: Secondary | ICD-10-CM

## 2022-05-30 DIAGNOSIS — M542 Cervicalgia: Secondary | ICD-10-CM

## 2022-05-30 DIAGNOSIS — R21 Rash and other nonspecific skin eruption: Secondary | ICD-10-CM

## 2022-05-30 DIAGNOSIS — Z6828 Body mass index (BMI) 28.0-28.9, adult: Secondary | ICD-10-CM

## 2022-05-30 NOTE — Progress Notes (Signed)
Patient ID: Monique Ellison, female    DOB: 07-24-1985, 37 y.o.   MRN: [id number]   Chief Complaint  Patient presents with   Advice Only   Breast Problem    Mammary Hyperplasia: The patient is a 37 y.o. female with a history of mammary hyperplasia for several years.  She has extremely large breasts causing symptoms that include the following: Back pain in the upper and lower back, including neck pain. She pulls or pins her bra straps to provide better lift and relief of the pressure and pain. She notices relief by holding her breast up manually.  Her shoulder straps cause grooves and pain and pressure that requires padding for relief. Pain medication is sometimes required with motrin and tylenol.  Activities that are hindered by enlarged breasts include: exercise and running.  She has tried supportive clothing as well as fitted bras without improvement.  Her breasts are extremely large and fairly symmetric.  She has hyperpigmentation of the inframammary area on both sides.  The sternal to nipple distance on the right is 28 cm and the left is 27 cm.  The IMF distance is 11 cm.  She is 5 feet 1 inches tall and weighs 150 pounds.  The BMI = 28.3 kg/m.  Preoperative bra size = DD cup. She would be ok with a B/C cup.  The estimated excess breast tissue to be removed at the time of surgery = 340 grams on the left and 340 grams on the right.  Mammogram history:none.  Family history of breast cancer:  paternal aunt.  Tobacco use:  none.   The patient expresses the desire to pursue surgical intervention. She works at the hospital in nursing care.  She has already had physical therapy without improvement of the neck and back pain.    Review of Systems  Constitutional:  Positive for activity change. Negative for appetite change.  Eyes: Negative.   Respiratory: Negative.    Cardiovascular: Negative.   Gastrointestinal: Negative.   Endocrine: Negative.   Genitourinary: Negative.   Musculoskeletal:   Positive for neck pain.  Skin:  Positive for rash.  Allergic/Immunologic: Negative.   Neurological: Negative.     Past Medical History:  Diagnosis Date   Family history of heart disease 05/29/2015   Father had MI in 48's   Former smoker 05/29/2015   Quit 2008   GERD (gastroesophageal reflux disease) 05/29/2015   Seasonal allergies     Past Surgical History:  Procedure Laterality Date   WISDOM TOOTH EXTRACTION        Current Outpatient Medications:    escitalopram (LEXAPRO) 10 MG tablet, Take 1 tablet (10 mg total) by mouth daily., Disp: 90 tablet, Rfl: 3   loratadine (CLARITIN) 10 MG tablet, Take 1 tablet (10 mg total) by mouth daily., Disp: 90 tablet, Rfl: 3   Norethindrone Acetate-Ethinyl Estrad-FE (BLISOVI 24 FE) 1-20 MG-MCG(24) tablet, Take 1 tablet by mouth every day, Disp: 84 tablet, Rfl: 4   rizatriptan (MAXALT) 10 MG tablet, Take 1 tablet (10 mg total) by mouth as needed for migraine. May repeat once in 2 hours if needed. Do not take more than 2 doses in 24 hours., Disp: 20 tablet, Rfl: 6   Vitamin D, Ergocalciferol, (DRISDOL) 1.25 MG (50000 UNIT) CAPS capsule, Take 1 capsule (50,000 Units total) by mouth every 7 (seven) days. Repeat labs when complete., Disp: 12 capsule, Rfl: 0   Objective:   Vitals:   05/30/22 1409  BP: 123/76  Pulse: 82  SpO2: 96%    Physical Exam Vitals and nursing note reviewed.  Constitutional:      Appearance: Normal appearance.  HENT:     Head: Normocephalic and atraumatic.  Cardiovascular:     Rate and Rhythm: Normal rate.     Pulses: Normal pulses.  Pulmonary:     Effort: Pulmonary effort is normal.  Abdominal:     General: There is no distension.     Palpations: Abdomen is soft.  Musculoskeletal:        General: No swelling or deformity.  Skin:    General: Skin is warm.     Capillary Refill: Capillary refill takes less than 2 seconds.     Coloration: Skin is not jaundiced.     Findings: No bruising.  Neurological:     Mental  Status: She is alert and oriented to person, place, and time.  Psychiatric:        Mood and Affect: Mood normal.        Behavior: Behavior normal.        Thought Content: Thought content normal.        Judgment: Judgment normal.     Assessment & Plan:  Large breasts  Neck pain  Chronic bilateral thoracic back pain  The procedure the patient selected and that was best for the patient was discussed. The risk were discussed and include but not limited to the following:  Breast asymmetry, fluid accumulation, firmness of the breast, inability to breast feed, loss of nipple or areola, skin loss, change in skin and nipple sensation, fat necrosis of the breast tissue, bleeding, infection and healing delay.  There are risks of anesthesia and injury to nerves or blood vessels.  Allergic reaction to tape, suture and skin glue are possible.  There will be swelling.  Any of these can lead to the need for revisional surgery which is not included in this surgery.  A breast reduction has potential to interfere with diagnostic procedures in the future.  This procedure is best done when the breast is fully developed.  Changes in the breast will continue to occur over time: pregnancy, weight gain or weigh loss. No guarantees are given for a certain bra or breast size.    Total time: 40 minutes. This includes time spent with the patient during the visit as well as time spent before and after the visit reviewing the chart, documenting the encounter, ordering pertinent studies and literature for the patient.   Physical therapy:  done Mammogram:  not indicated  Patient is a good candidate for a bilateral breast reduction with possible lateral liposuction.  Pictures were obtained of the patient and placed in the chart with the patient's or guardian's permission.   Alena Bills Zakir Henner, DO

## 2022-06-02 DIAGNOSIS — R1904 Left lower quadrant abdominal swelling, mass and lump: Secondary | ICD-10-CM | POA: Diagnosis not present

## 2022-06-02 DIAGNOSIS — R319 Hematuria, unspecified: Secondary | ICD-10-CM | POA: Diagnosis not present

## 2022-06-02 DIAGNOSIS — N83202 Unspecified ovarian cyst, left side: Secondary | ICD-10-CM | POA: Diagnosis not present

## 2022-06-06 ENCOUNTER — Other Ambulatory Visit (HOSPITAL_COMMUNITY): Payer: Self-pay

## 2022-06-09 DIAGNOSIS — F411 Generalized anxiety disorder: Secondary | ICD-10-CM | POA: Diagnosis not present

## 2022-06-09 DIAGNOSIS — F32A Depression, unspecified: Secondary | ICD-10-CM | POA: Diagnosis not present

## 2022-06-27 DIAGNOSIS — F32A Depression, unspecified: Secondary | ICD-10-CM | POA: Diagnosis not present

## 2022-06-27 DIAGNOSIS — F411 Generalized anxiety disorder: Secondary | ICD-10-CM | POA: Diagnosis not present

## 2022-07-11 ENCOUNTER — Other Ambulatory Visit: Payer: BC Managed Care – PPO

## 2022-07-12 ENCOUNTER — Institutional Professional Consult (permissible substitution): Payer: BC Managed Care – PPO | Admitting: Plastic Surgery

## 2022-07-14 DIAGNOSIS — N83202 Unspecified ovarian cyst, left side: Secondary | ICD-10-CM | POA: Diagnosis not present

## 2022-07-20 ENCOUNTER — Other Ambulatory Visit: Payer: Self-pay

## 2022-07-20 ENCOUNTER — Encounter (HOSPITAL_BASED_OUTPATIENT_CLINIC_OR_DEPARTMENT_OTHER): Payer: Self-pay | Admitting: Obstetrics and Gynecology

## 2022-07-20 NOTE — Progress Notes (Signed)
Spoke w/ via phone for pre-op interview---pt Lab needs dos----  cbc, t & s, urine preg             Lab results------none COVID test -----patient states asymptomatic no test needed Arrive at -------530 am 08-02-2022 NPO after MN NO Solid Food.  Clear liquids from MN until---430 am Med rec completed Medications to take morning of surgery -----none Diabetic medication -----n/a Patient instructed no nail polish to be worn day of surgery Patient instructed to bring photo id and insurance card day of surgery Patient aware to have Driver (ride ) / caregiver  husband Monique Ellison  for 24 hours after surgery  Patient Special Instructions -----none Pre-Op special Instructions -----none Patient verbalized understanding of instructions that were given at this phone interview. Patient denies shortness of breath, chest pain, fever, cough at this phone interview.

## 2022-07-30 ENCOUNTER — Other Ambulatory Visit (HOSPITAL_COMMUNITY): Payer: Self-pay

## 2022-08-02 DIAGNOSIS — N83202 Unspecified ovarian cyst, left side: Secondary | ICD-10-CM

## 2022-08-02 DIAGNOSIS — Z01818 Encounter for other preprocedural examination: Secondary | ICD-10-CM

## 2022-08-22 DIAGNOSIS — F32A Depression, unspecified: Secondary | ICD-10-CM | POA: Diagnosis not present

## 2022-08-22 DIAGNOSIS — F411 Generalized anxiety disorder: Secondary | ICD-10-CM | POA: Diagnosis not present

## 2022-09-01 ENCOUNTER — Encounter (HOSPITAL_BASED_OUTPATIENT_CLINIC_OR_DEPARTMENT_OTHER): Payer: Self-pay | Admitting: Obstetrics and Gynecology

## 2022-09-01 ENCOUNTER — Other Ambulatory Visit: Payer: Self-pay

## 2022-09-01 NOTE — Progress Notes (Signed)
Spoke w/ via phone for pre-op interview: patient  Lab needs dos: CBC, UPT, T&S Lab results: NA COVID test: patient states asymptomatic no test needed. Arrive at 0530 09/23/22 NPO after MN except clear liquids. Clear liquids from MN until 0430 Med rec completed. Medications to take morning of surgery: none Diabetic medication: NA Patient instructed no nail polish to be worn day of surgery. Patient instructed to bring photo id and insurance card day of surgery. Patient aware to have driver (ride ) / caregiver for 24 hours after surgery. Husband to drive. Special Instructions: NA Patient verbalized understanding of instructions that were given at this phone interview. Patient denies shortness of breath, chest pain, fever, cough at this phone interview.

## 2022-09-05 DIAGNOSIS — F411 Generalized anxiety disorder: Secondary | ICD-10-CM | POA: Diagnosis not present

## 2022-09-05 DIAGNOSIS — F32A Depression, unspecified: Secondary | ICD-10-CM | POA: Diagnosis not present

## 2022-09-12 DIAGNOSIS — F32A Depression, unspecified: Secondary | ICD-10-CM | POA: Diagnosis not present

## 2022-09-12 DIAGNOSIS — F411 Generalized anxiety disorder: Secondary | ICD-10-CM | POA: Diagnosis not present

## 2022-09-21 NOTE — H&P (Signed)
Preadmission Preoperative History and Physical  Monique Ellison is an 37 y.o. female. G0 presenting for scheduled laparoscopic ovarian cystectomy. [place] with 6x5x6cm simple L ovarian cyst persistent for over one year. After discussion, she opted for surgical management. No pain, fevers, chills, AUB.  Pertinent Gynecological History: Menses: regular every month without intermenstrual spotting Bleeding: n/a Contraception: OCP (estrogen/progesterone) DES exposure: unknown Blood transfusions: none Sexually transmitted diseases: no past history Previous GYN Procedures:  LEEP   Last mammogram: n/a Last pap: normal Date: 2024 OB History: G0    Past Medical History:  Diagnosis Date   Anxiety    Family history of heart disease 05/29/2015   Father had MI in 86's   Former smoker 05/29/2015   Quit 2008   GERD (gastroesophageal reflux disease) 05/29/2015   Left ovarian cyst    migraines    Seasonal allergies     Past Surgical History:  Procedure Laterality Date   WISDOM TOOTH EXTRACTION      Family History  Problem Relation Age of Onset   Diabetes Mother    Hyperlipidemia Mother    Stroke Mother    Hypertension Father    Stroke Father    Heart attack Father    Depression Brother    Diabetes Maternal Grandmother     Social History:  reports that she has quit smoking. She has never used smokeless tobacco. She reports that she does not currently use drugs. She reports that she does not drink alcohol.  Allergies: No Known Allergies  No medications prior to admission.   ROS neg or otherwise noted in HPI.  Vitals reviewed in office, wnl. Height 5\' 1"  (1.549 m), weight 65.8 kg, last menstrual period 08/11/2022.  Constitutional:      Appearance: Normal appearance.  HENT:     Head: Normocephalic.  Eyes:     Pupils: Pupils are equal, round. Cardiovascular:     Rate and Rhythm: Normal rate and regular rhythm.     Pulses: Normal pulses.  Abdominal:     General: Abdomen is  nontender, nondistended Neurological:     Mental Status: She is alert.   Labs to be drawn in preop.  Assessment/Plan: 36yo G0 presenting for surgical management of ovarian  US 05/2022 with 6x5x6cm simple L ovarian cyst, no ovarian tissue visualized We discussed planned surgery - Lsc ovarian cystectomy, possible USO in the case of persistent bleeding from cyst bed. Additionally she understands the possibility of needing laparotomy, which is a risk in any laparoscopic procedure. R/b/a discussed, plan to proceed with scheduled surgery. All questions answered. Consents signed in the office.  Monique Ellison 09/21/2022, 11:07 AM

## 2022-09-23 ENCOUNTER — Ambulatory Visit (HOSPITAL_BASED_OUTPATIENT_CLINIC_OR_DEPARTMENT_OTHER): Payer: BC Managed Care – PPO | Admitting: Anesthesiology

## 2022-09-23 ENCOUNTER — Ambulatory Visit (HOSPITAL_BASED_OUTPATIENT_CLINIC_OR_DEPARTMENT_OTHER)
Admission: RE | Admit: 2022-09-23 | Discharge: 2022-09-23 | Disposition: A | Discharge status: Home / Self Care | Payer: BC Managed Care – PPO | Attending: Obstetrics and Gynecology | Admitting: Obstetrics and Gynecology

## 2022-09-23 ENCOUNTER — Encounter (HOSPITAL_BASED_OUTPATIENT_CLINIC_OR_DEPARTMENT_OTHER): Payer: Self-pay | Admitting: Obstetrics and Gynecology

## 2022-09-23 ENCOUNTER — Other Ambulatory Visit (HOSPITAL_COMMUNITY): Payer: Self-pay

## 2022-09-23 ENCOUNTER — Other Ambulatory Visit: Payer: Self-pay

## 2022-09-23 ENCOUNTER — Encounter (HOSPITAL_BASED_OUTPATIENT_CLINIC_OR_DEPARTMENT_OTHER)
Admission: RE | Disposition: A | Discharge status: Home / Self Care | Payer: Self-pay | Source: Home / Self Care | Attending: Obstetrics and Gynecology

## 2022-09-23 DIAGNOSIS — Z87891 Personal history of nicotine dependence: Secondary | ICD-10-CM | POA: Insufficient documentation

## 2022-09-23 DIAGNOSIS — N83202 Unspecified ovarian cyst, left side: Secondary | ICD-10-CM | POA: Diagnosis not present

## 2022-09-23 DIAGNOSIS — Z01818 Encounter for other preprocedural examination: Secondary | ICD-10-CM

## 2022-09-23 HISTORY — PX: LAPAROSCOPIC OVARIAN CYSTECTOMY: SHX6248

## 2022-09-23 HISTORY — DX: Anxiety disorder, unspecified: F41.9

## 2022-09-23 HISTORY — DX: Unspecified ovarian cyst, left side: N83.202

## 2022-09-23 HISTORY — DX: Headache, unspecified: R51.9

## 2022-09-23 LAB — CBC
HCT: 38.4 % (ref 36.0–46.0)
Hemoglobin: 12.9 g/dL (ref 12.0–15.0)
MCH: 28.5 pg (ref 26.0–34.0)
MCHC: 33.6 g/dL (ref 30.0–36.0)
MCV: 85 fL (ref 80.0–100.0)
Platelets: 256 10*3/uL (ref 150–400)
RBC: 4.52 MIL/uL (ref 3.87–5.11)
RDW: 12.5 % (ref 11.5–15.5)
WBC: 7.3 10*3/uL (ref 4.0–10.5)
nRBC: 0 % (ref 0.0–0.2)

## 2022-09-23 LAB — TYPE AND SCREEN
ABO/RH(D): O POS
Antibody Screen: NEGATIVE

## 2022-09-23 LAB — POCT PREGNANCY, URINE: Preg Test, Ur: NEGATIVE

## 2022-09-23 LAB — ABO/RH: ABO/RH(D): O POS

## 2022-09-23 SURGERY — EXCISION, CYST, OVARY, LAPAROSCOPIC
Anesthesia: General | Laterality: Left

## 2022-09-23 MED ORDER — LIDOCAINE HCL (PF) 2 % IJ SOLN
INTRAMUSCULAR | Status: AC
  Filled 2022-09-23: qty 5

## 2022-09-23 MED ORDER — MIDAZOLAM HCL 2 MG/2ML IJ SOLN
INTRAMUSCULAR | Status: AC
  Filled 2022-09-23: qty 2

## 2022-09-23 MED ORDER — ONDANSETRON HCL 4 MG/2ML IJ SOLN
INTRAMUSCULAR | Status: AC
  Filled 2022-09-23: qty 2

## 2022-09-23 MED ORDER — HYDROMORPHONE HCL 1 MG/ML IJ SOLN: 0.2500 mg | INTRAMUSCULAR | Status: DC | PRN

## 2022-09-23 MED ORDER — DEXAMETHASONE SODIUM PHOSPHATE 10 MG/ML IJ SOLN
INTRAMUSCULAR | Status: AC
  Filled 2022-09-23: qty 1

## 2022-09-23 MED ORDER — ROCURONIUM BROMIDE 100 MG/10ML IV SOLN
INTRAVENOUS | Status: DC | PRN
  Administered 2022-09-23: 60 mg via INTRAVENOUS

## 2022-09-23 MED ORDER — POVIDONE-IODINE 10 % EX SWAB: 2.0000 | Freq: Once | CUTANEOUS | Status: DC

## 2022-09-23 MED ORDER — LACTATED RINGERS IV SOLN: INTRAVENOUS | Status: DC

## 2022-09-23 MED ORDER — BUPIVACAINE HCL (PF) 0.25 % IJ SOLN
INTRAMUSCULAR | Status: DC | PRN
  Administered 2022-09-23: 15 mL

## 2022-09-23 MED ORDER — ONDANSETRON HCL 4 MG/2ML IJ SOLN
INTRAMUSCULAR | Status: DC | PRN
  Administered 2022-09-23: 4 mg via INTRAVENOUS

## 2022-09-23 MED ORDER — KETOROLAC TROMETHAMINE 30 MG/ML IJ SOLN
INTRAMUSCULAR | Status: DC | PRN
  Administered 2022-09-23: 30 mg via INTRAVENOUS

## 2022-09-23 MED ORDER — OXYCODONE HCL 5 MG PO TABS
5.0000 mg | ORAL_TABLET | ORAL | 0 refills | Status: AC | PRN
  Filled 2022-09-23 (×2): qty 10, 2d supply, fill #0

## 2022-09-23 MED ORDER — FENTANYL CITRATE (PF) 100 MCG/2ML IJ SOLN
INTRAMUSCULAR | Status: AC
  Filled 2022-09-23: qty 2

## 2022-09-23 MED ORDER — MEPERIDINE HCL 25 MG/ML IJ SOLN: 6.2500 mg | INTRAMUSCULAR | Status: DC | PRN

## 2022-09-23 MED ORDER — SODIUM CHLORIDE 0.9 % IR SOLN
Status: DC | PRN
Start: 2022-09-23 — End: 2022-09-23
  Administered 2022-09-23: 1000 mL

## 2022-09-23 MED ORDER — SUGAMMADEX SODIUM 200 MG/2ML IV SOLN
INTRAVENOUS | Status: DC | PRN
  Administered 2022-09-23: 200 mg via INTRAVENOUS

## 2022-09-23 MED ORDER — PROMETHAZINE HCL 25 MG/ML IJ SOLN: 6.2500 mg | INTRAMUSCULAR | Status: DC | PRN

## 2022-09-23 MED ORDER — LIDOCAINE HCL (PF) 2 % IJ SOLN
INTRAMUSCULAR | Status: DC | PRN
  Administered 2022-09-23: 10 mg via INTRADERMAL
  Administered 2022-09-23: 60 mg via INTRADERMAL

## 2022-09-23 MED ORDER — AMISULPRIDE (ANTIEMETIC) 5 MG/2ML IV SOLN: 10.0000 mg | Freq: Once | INTRAVENOUS | Status: DC | PRN

## 2022-09-23 MED ORDER — OXYCODONE HCL 5 MG PO TABS: 5.0000 mg | ORAL_TABLET | Freq: Once | ORAL | Status: DC | PRN

## 2022-09-23 MED ORDER — ACETAMINOPHEN 500 MG PO TABS
1000.0000 mg | ORAL_TABLET | Freq: Once | ORAL | Status: AC
  Administered 2022-09-23: 1000 mg via ORAL

## 2022-09-23 MED ORDER — PROPOFOL 10 MG/ML IV BOLUS
INTRAVENOUS | Status: DC | PRN
Start: 2022-09-23 — End: 2022-09-23
  Administered 2022-09-23: 150 mg via INTRAVENOUS
  Administered 2022-09-23: 50 mg via INTRAVENOUS

## 2022-09-23 MED ORDER — ACETAMINOPHEN 500 MG PO TABS
ORAL_TABLET | ORAL | Status: AC
  Filled 2022-09-23: qty 2

## 2022-09-23 MED ORDER — OXYCODONE HCL 5 MG/5ML PO SOLN: 5.0000 mg | Freq: Once | ORAL | Status: DC | PRN

## 2022-09-23 MED ORDER — PROPOFOL 10 MG/ML IV BOLUS
INTRAVENOUS | Status: AC
  Filled 2022-09-23: qty 20

## 2022-09-23 MED ORDER — ROCURONIUM BROMIDE 10 MG/ML (PF) SYRINGE
PREFILLED_SYRINGE | INTRAVENOUS | Status: AC
  Filled 2022-09-23: qty 10

## 2022-09-23 MED ORDER — MIDAZOLAM HCL 5 MG/5ML IJ SOLN
INTRAMUSCULAR | Status: DC | PRN
  Administered 2022-09-23: 2 mg via INTRAVENOUS

## 2022-09-23 MED ORDER — SENNOSIDES-DOCUSATE SODIUM 8.6-50 MG PO TABS
1.0000 | ORAL_TABLET | Freq: Every day | ORAL | 1 refills | Status: AC
  Filled 2022-09-23: qty 30, 30d supply, fill #0

## 2022-09-23 MED ORDER — DEXAMETHASONE SODIUM PHOSPHATE 4 MG/ML IJ SOLN
INTRAMUSCULAR | Status: DC | PRN
  Administered 2022-09-23: 5 mg via INTRAVENOUS

## 2022-09-23 MED ORDER — FENTANYL CITRATE (PF) 100 MCG/2ML IJ SOLN
INTRAMUSCULAR | Status: DC | PRN
  Administered 2022-09-23 (×4): 50 ug via INTRAVENOUS

## 2022-09-23 SURGICAL SUPPLY — 42 items
ADH SKN CLS APL DERMABOND .7 (GAUZE/BANDAGES/DRESSINGS) ×1
AGENT HMST MTR 8 SURGIFLO (HEMOSTASIS) ×1
APL ESCP 34 STRL LF DISP (HEMOSTASIS) ×1
APPLICATOR SURGIFLO ENDO (HEMOSTASIS) IMPLANT
CABLE HIGH FREQUENCY MONO STRZ (ELECTRODE) IMPLANT
COVER MAYO STAND STRL (DRAPES) ×1 IMPLANT
DERMABOND ADVANCED .7 DNX12 (GAUZE/BANDAGES/DRESSINGS) IMPLANT
DRAPE SURG IRRIG POUCH 19X23 (DRAPES) ×1 IMPLANT
DRSG COVADERM PLUS 2X2 (GAUZE/BANDAGES/DRESSINGS) IMPLANT
DURAPREP 26ML APPLICATOR (WOUND CARE) ×1 IMPLANT
GAUZE 4X4 16PLY ~~LOC~~+RFID DBL (SPONGE) IMPLANT
GLOVE BIOGEL PI IND STRL 6 (GLOVE) ×1 IMPLANT
GLOVE SS PI 5.5 STRL (GLOVE) ×1 IMPLANT
GOWN STRL REUS W/TWL LRG LVL3 (GOWN DISPOSABLE) ×1 IMPLANT
IRRIG SUCT STRYKERFLOW 2 WTIP (MISCELLANEOUS) ×1
IRRIGATION SUCT STRKRFLW 2 WTP (MISCELLANEOUS) ×1 IMPLANT
KIT PINK PAD W/HEAD ARE REST (MISCELLANEOUS) ×1
KIT PINK PAD W/HEAD ARM REST (MISCELLANEOUS) ×1 IMPLANT
KIT TURNOVER CYSTO (KITS) ×1 IMPLANT
LIGASURE VESSEL 5MM BLUNT TIP (ELECTROSURGICAL) IMPLANT
NDL INSUFFLATION 14GA 120MM (NEEDLE) ×1 IMPLANT
NEEDLE INSUFFLATION 14GA 120MM (NEEDLE) IMPLANT
NS IRRIG 500ML POUR BTL (IV SOLUTION) ×1 IMPLANT
PACK LAPAROSCOPY BASIN (CUSTOM PROCEDURE TRAY) ×1 IMPLANT
PAD OB MATERNITY 4.3X12.25 (PERSONAL CARE ITEMS) ×1 IMPLANT
SEALER TISSUE G2 CVD JAW 45CM (ENDOMECHANICALS) IMPLANT
SET TUBE SMOKE EVAC HIGH FLOW (TUBING) ×1 IMPLANT
SLEEVE SCD COMPRESS KNEE MED (STOCKING) ×1 IMPLANT
SLEEVE Z-THREAD 5X100MM (TROCAR) ×2 IMPLANT
SPONGE SURGIFLO 8M (HEMOSTASIS) IMPLANT
STRIP CLOSURE SKIN 1/4X4 (GAUZE/BANDAGES/DRESSINGS) IMPLANT
SUT MNCRL AB 3-0 PS2 18 (SUTURE) IMPLANT
SUT VIC AB 4-0 PS2 27 (SUTURE) IMPLANT
SUT VICRYL 0 UR6 27IN ABS (SUTURE) ×1 IMPLANT
SYS RETRIEVAL 5MM INZII UNIV (BASKET) ×1
SYSTEM RETRIEVL 5MM INZII UNIV (BASKET) IMPLANT
TOWEL OR 17X24 6PK STRL BLUE (TOWEL DISPOSABLE) ×1 IMPLANT
TRAY FOLEY W/BAG SLVR 14FR LF (SET/KITS/TRAYS/PACK) IMPLANT
TROCAR KII 8X100ML NONTHREADED (TROCAR) IMPLANT
TROCAR Z-THREAD BLADED 11X100M (TROCAR) IMPLANT
TROCAR Z-THREAD FIOS 5X100MM (TROCAR) ×1 IMPLANT
WARMER LAPAROSCOPE (MISCELLANEOUS) ×1 IMPLANT

## 2022-09-23 NOTE — Anesthesia Procedure Notes (Signed)
Procedure Name: Intubation Date/Time: 09/23/2022 7:44 AM  Performed by: Jessica Priest, CRNAPre-anesthesia Checklist: Patient identified, Emergency Drugs available, Suction available, Patient being monitored and Timeout performed Patient Re-evaluated:Patient Re-evaluated prior to induction Oxygen Delivery Method: Circle system utilized Preoxygenation: Pre-oxygenation with 100% oxygen Induction Type: IV induction Ventilation: Mask ventilation without difficulty Laryngoscope Size: 3 and Mac Grade View: Grade II Tube type: Oral Tube size: 7.0 mm Number of attempts: 1 Airway Equipment and Method: Stylet and Oral airway Placement Confirmation: ETT inserted through vocal cords under direct vision, positive ETCO2, breath sounds checked- equal and bilateral and CO2 detector Secured at: 21 cm Tube secured with: Tape Dental Injury: Teeth and Oropharynx as per pre-operative assessment

## 2022-09-23 NOTE — Op Note (Addendum)
PREOPERATIVE DIAGNOSES: 1. Left ovarian cyst - simple  POSTOPERATIVE DIAGNOSES: Same  PROCEDURE PERFORMED: Laparoscopic left ovarian cystectomy  SURGEON: Dr. Jule Economy  ANESTHESIA: GET  ESTIMATED BLOOD LOSS: 15cc.  COMPLICATIONS: None  TUBES: None.  DRAINS: None  PATHOLOGY: L ovarian cyst wall  FINDINGS: On exam, under anesthesia, normal appearing vulva and vagina, normal-sized uterus. Upper abdomen examined, wnl. 6cm simple L ovarian cyst with additional small <1cm ovarian cyst in L ovary, also simple-appearing. Normal-appearing right ovary.  Procedure: The patient was consented and seen preoperatively. Consent was signed and witnessed prior to procedure start with all questions answered. Antibiotics were not indicated. She was taken to the operating room, and placed under general anesthesia with pneumatic compression stockings in place on her lower extremities. The patient was then placed in a dorsal lithotomy position with Allen stirrups. The patient was prepped and draped in the normal sterile fashion. A time-out was performed as per [facility] standards with everyone in agreement.   A Foley catheter was inserted with yellow urine found to be draining appropriately. A sterile speculum was placed and a Hulka manipulator was placed. The speculum was removed.  The skin in and around the umbilicus was injected with 0.025% Bupivacaine plain. A 5mm skin incision was made at the base of the umbilicus. The 5mm Optiview trocar with rounded tip obturator was introduced to the abdomen without difficulty with careful elevation of the anterior abdominal wall. The obturator was removed. Pneumoperitoneum was initiated at high flow. The 5mm scope was then inserted to the port for inspection of the abdomen with findings noted above.  Next, one 5mm and one 8mm port were placed, one laterally and the other between the umbilical port and the lateral port. The skin was anesthetized in a similar fashion as  above. A small skin incision was made with the scalpel bilaterally. The ports were placed under direct visualization without difficulty.  The patient was placed in Trendelenburg. The ovarian cortex was incised opposite from the IP ligament using monopolar scissors. The cortex was thin and entry was noted into the cyst - clear serous fluid suctioned. The cyst wall was carefully separated from remaining ovarian tissue sequentially using blunt dissection and traction and removed. The cyst wall was removed from the abdomen in an endocatch bag. The ovarian tissue was examined, excellent hemostasis was noted. Surgiflo was placed into the remaining ovarian cortex and the ovary was replaced.  The abdomen and pelvis were thoroughly inspected. Good hemostasis was appreciated throughout. All instruments were removed from the abdomen. The pneumoperitoneum was released and correct instrument counts were confirmed. Skin incisions were closed with 4-0 Vicryl. The patient tolerated the procedure well. She was taken to the recovery room in stable condition. The patient will be discharged from the PACU after all criteria are met.     Jule Economy, MD

## 2022-09-23 NOTE — Anesthesia Preprocedure Evaluation (Signed)
Anesthesia Evaluation  Patient identified by MRN, date of birth, ID band Patient awake    Reviewed: Allergy & Precautions, H&P , NPO status , Patient's Chart, lab work & pertinent test results  Airway Mallampati: II  TM Distance: >3 FB Neck ROM: Full    Dental no notable dental hx.    Pulmonary neg pulmonary ROS, former smoker   Pulmonary exam normal breath sounds clear to auscultation       Cardiovascular negative cardio ROS Normal cardiovascular exam Rhythm:Regular Rate:Normal     Neuro/Psych  Headaches  Anxiety      negative psych ROS   GI/Hepatic negative GI ROS, Neg liver ROS,GERD  ,,  Endo/Other  negative endocrine ROS    Renal/GU negative Renal ROS  negative genitourinary   Musculoskeletal negative musculoskeletal ROS (+)    Abdominal   Peds negative pediatric ROS (+)  Hematology negative hematology ROS (+)   Anesthesia Other Findings   Reproductive/Obstetrics negative OB ROS                             Anesthesia Physical Anesthesia Plan  ASA: 2  Anesthesia Plan: General   Post-op Pain Management: Dilaudid IV   Induction: Intravenous  PONV Risk Score and Plan: 3 and Ondansetron, Dexamethasone, Midazolam and Treatment may vary due to age or medical condition  Airway Management Planned: Oral ETT  Additional Equipment:   Intra-op Plan:   Post-operative Plan: Extubation in OR  Informed Consent: I have reviewed the patients History and Physical, chart, labs and discussed the procedure including the risks, benefits and alternatives for the proposed anesthesia with the patient or authorized representative who has indicated his/her understanding and acceptance.     Dental advisory given  Plan Discussed with: CRNA  Anesthesia Plan Comments:        Anesthesia Quick Evaluation

## 2022-09-23 NOTE — Anesthesia Postprocedure Evaluation (Signed)
Anesthesia Post Note  Patient: Monique Ellison  Procedure(s) Performed: LAPAROSCOPIC OVARIAN CYSTECTOMY (Left)     Patient location during evaluation: PACU Anesthesia Type: General Level of consciousness: awake and alert Pain management: pain level controlled Vital Signs Assessment: post-procedure vital signs reviewed and stable Respiratory status: spontaneous breathing, nonlabored ventilation and respiratory function stable Cardiovascular status: blood pressure returned to baseline and stable Postop Assessment: no apparent nausea or vomiting Anesthetic complications: no   No notable events documented.  Last Vitals:  Vitals:   09/23/22 0945 09/23/22 1015  BP: 121/75 123/76  Pulse: 85 90  Resp: 20 18  Temp: 36.7 C   SpO2: 95% 97%    Last Pain:  Vitals:   09/23/22 0945  TempSrc:   PainSc: 1                  Lowella Curb

## 2022-09-23 NOTE — Discharge Instructions (Addendum)
No ibuprofen, Advil, Aleve, Motrin, ketorolac, meloxicam, naproxen, or other NSAIDS until after 2:45pm today if needed for pain.    Post Anesthesia Home Care Instructions  Activity: Get plenty of rest for the remainder of the day. A responsible individual must stay with you for 24 hours following the procedure.  For the next 24 hours, DO NOT: -Drive a car -Advertising copywriter -Drink alcoholic beverages -Take any medication unless instructed by your physician -Make any legal decisions or sign important papers.  Meals: Start with liquid foods such as gelatin or soup. Progress to regular foods as tolerated. Avoid greasy, spicy, heavy foods. If nausea and/or vomiting occur, drink only clear liquids until the nausea and/or vomiting subsides. Call your physician if vomiting continues.  Special Instructions/Symptoms: Your throat may feel dry or sore from the anesthesia or the breathing tube placed in your throat during surgery. If this causes discomfort, gargle with warm salt water. The discomfort should disappear within 24 hours.

## 2022-09-23 NOTE — Transfer of Care (Signed)
Immediate Anesthesia Transfer of Care Note  Patient: Monique Ellison  Procedure(s) Performed: Procedure(s) (LRB): LAPAROSCOPIC OVARIAN CYSTECTOMY (Left)  Patient Location: PACU  Anesthesia Type: General  Level of Consciousness: awake, sedated, patient cooperative and responds to stimulation  Airway & Oxygen Therapy: Patient Spontanous Breathing and Patient connected to [place] oxygen  Post-op Assessment: Report given to PACU RN, Post -op Vital signs reviewed and stable and Patient moving all extremities  Post vital signs: Reviewed and stable  Complications: No apparent anesthesia complications

## 2022-09-23 NOTE — Interval H&P Note (Signed)
History and Physical Interval Note:  09/23/2022 7:11 AM  Monique Ellison  has presented today for surgery, with the diagnosis of LEFT OVARIAN CYST.  The various methods of treatment have been discussed with the patient and family. After consideration of risks, benefits and other options for treatment, the patient has consented to  Procedure(s): LAPAROSCOPIC OVARIAN CYSTECTOMY (Left) as a surgical intervention.  The patient's history has been reviewed, patient examined, no change in status, stable for surgery.  I have reviewed the patient's chart and labs.  Questions were answered to the patient's satisfaction.     Tawni Levy

## 2022-09-26 ENCOUNTER — Encounter (HOSPITAL_BASED_OUTPATIENT_CLINIC_OR_DEPARTMENT_OTHER): Payer: Self-pay | Admitting: Obstetrics and Gynecology

## 2022-09-27 ENCOUNTER — Telehealth: Payer: Self-pay | Admitting: Plastic Surgery

## 2022-09-27 NOTE — Telephone Encounter (Signed)
Faxed all info to [facility] waiting on approved status

## 2022-10-05 ENCOUNTER — Other Ambulatory Visit: Payer: Self-pay

## 2022-10-13 ENCOUNTER — Other Ambulatory Visit (HOSPITAL_BASED_OUTPATIENT_CLINIC_OR_DEPARTMENT_OTHER): Payer: Self-pay | Admitting: Nurse Practitioner

## 2022-10-13 ENCOUNTER — Other Ambulatory Visit (HOSPITAL_COMMUNITY): Payer: Self-pay

## 2022-10-13 DIAGNOSIS — G43019 Migraine without aura, intractable, without status migrainosus: Secondary | ICD-10-CM

## 2022-10-13 MED ORDER — RIZATRIPTAN BENZOATE 10 MG PO TABS
10.0000 mg | ORAL_TABLET | ORAL | 5 refills | Status: AC | PRN
Start: 2022-10-13 — End: ?
  Filled 2022-10-13: qty 20, 34d supply, fill #0

## 2022-10-20 ENCOUNTER — Telehealth: Payer: Self-pay | Admitting: Plastic Surgery

## 2022-10-20 NOTE — Telephone Encounter (Signed)
Called patient let her know her insurance is explased and see if she had new insurance

## 2022-10-25 ENCOUNTER — Other Ambulatory Visit (HOSPITAL_COMMUNITY): Payer: Self-pay

## 2022-10-27 ENCOUNTER — Other Ambulatory Visit (HOSPITAL_COMMUNITY): Payer: Self-pay

## 2022-12-08 ENCOUNTER — Other Ambulatory Visit (HOSPITAL_COMMUNITY): Payer: Self-pay

## 2023-08-01 IMAGING — MR MR LUMBAR SPINE W/O CM
4 of 5 series · 26 of 48 positions shown · non-contrast
Comparison: None.

CLINICAL DATA: Low back and right leg pain with weakness for 4
months.

EXAM:
MRI LUMBAR SPINE WITHOUT CONTRAST
TECHNIQUE: Multiplanar, multisequence MR imaging of the lumbar spine was
performed. No intravenous contrast was administered.

[Series 2: T2 · sagittal · 4.0mm · 0.53mm/px · 6 of 13 slices shown (1 of 2)]
[im 1/13]
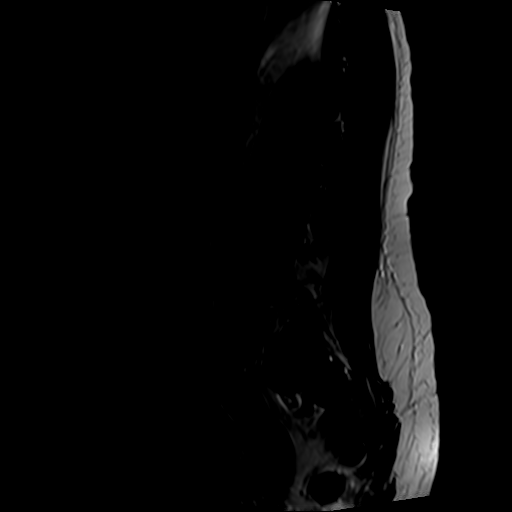
[im 3/13]
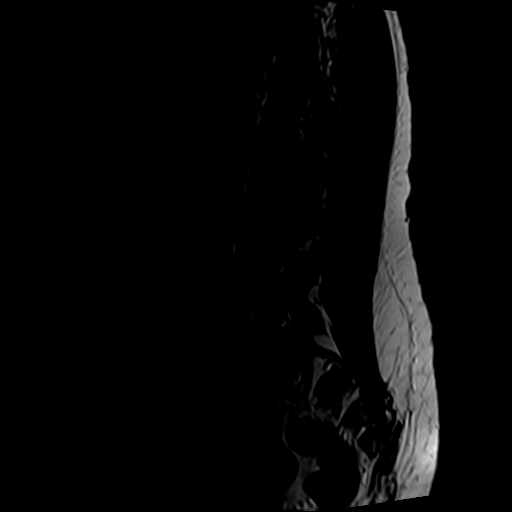
[im 5/13]
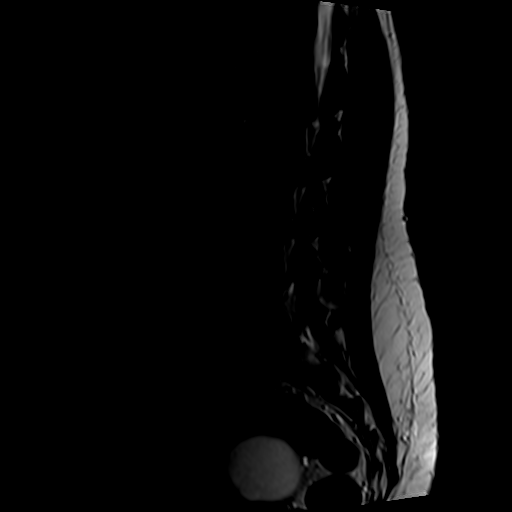
[im 8/13]
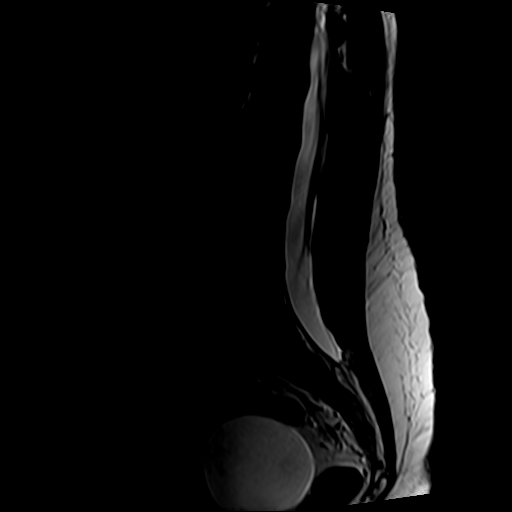
[im 10/13]
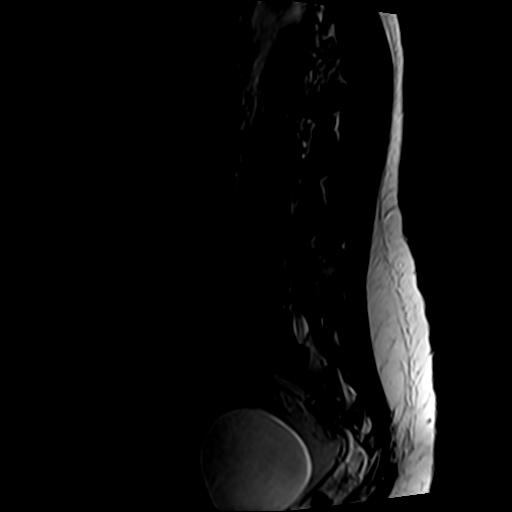
[im 13/13]
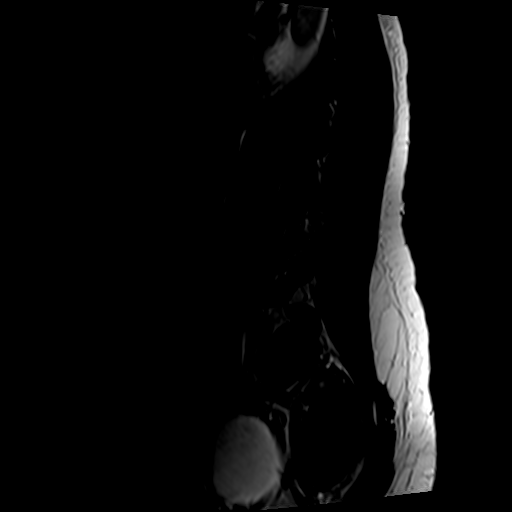

[Series 4: T1 · sagittal · 4.0mm · 0.53mm/px · 6 of 13 slices shown (1 of 2)]
[im 1/13]
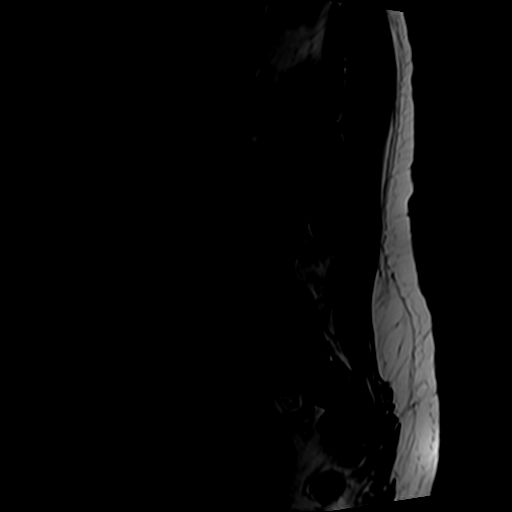
[im 3/13]
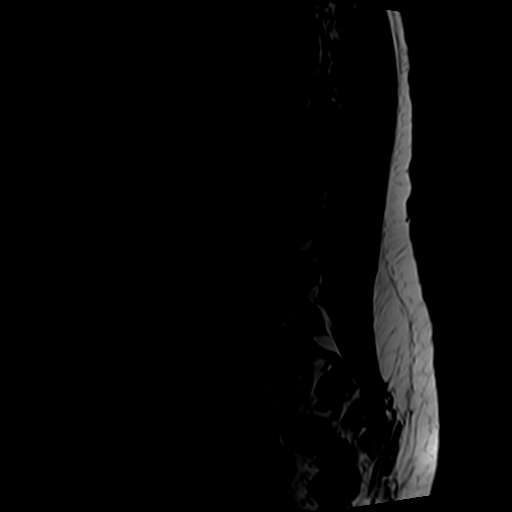
[im 5/13]
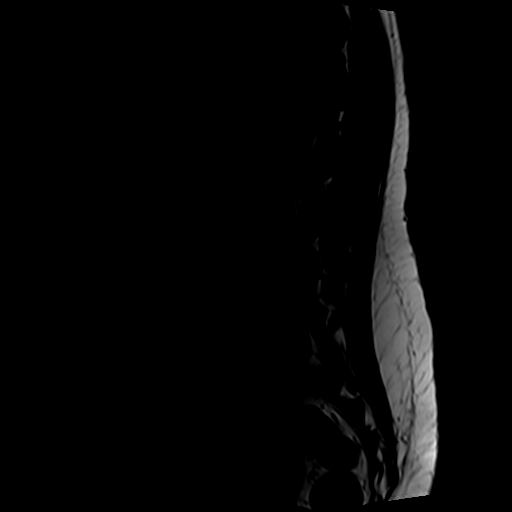
[im 8/13]
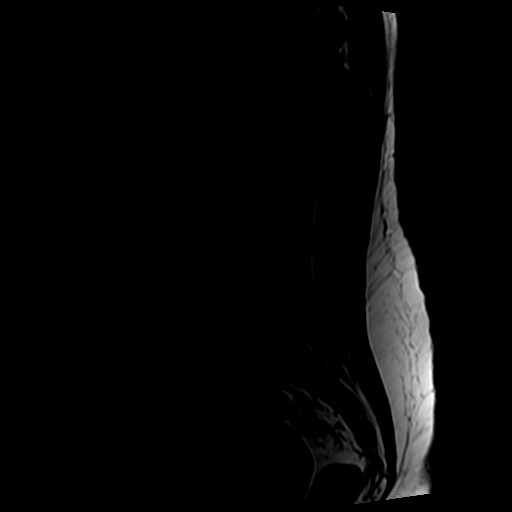
[im 10/13]
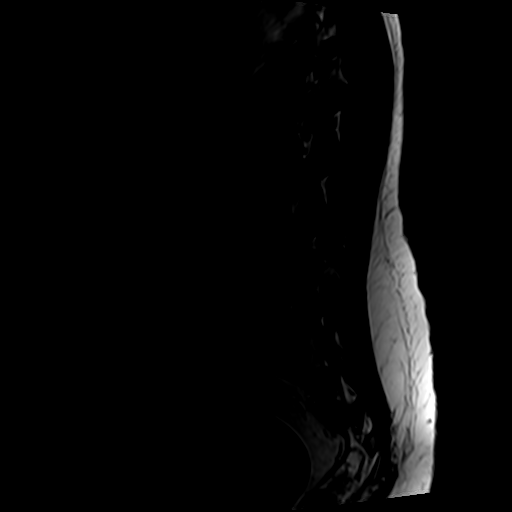
[im 13/13]
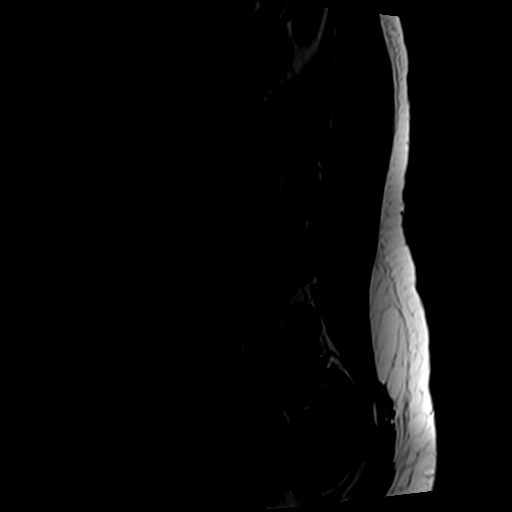

[Series 5: T2 · axial · 4.0mm · 0.70mm/px · z∈[-96,+104]mm · 9 of 34 slices shown (2 of 2)]
[im 1/34]
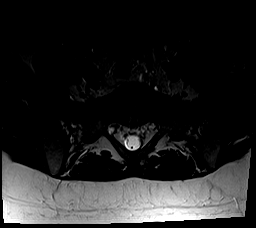
[im 5/34]
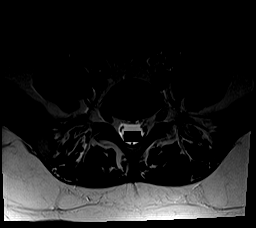
[im 10/34]
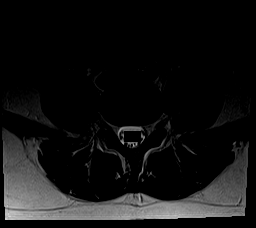
[im 15/34]
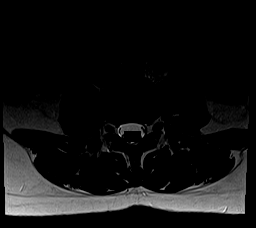
[im 17/34]
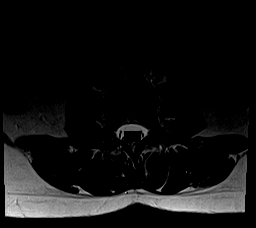
[im 19/34]
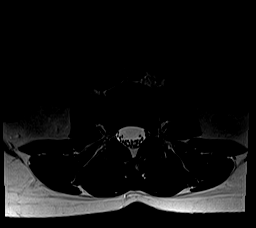
[im 24/34]
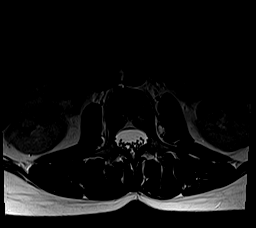
[im 29/34]
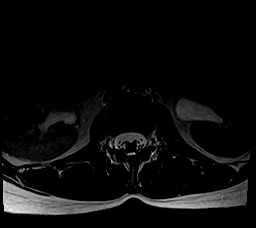
[im 34/34]
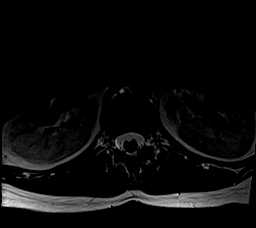

[Series 6: T1 · axial · 4.0mm · 0.35mm/px · z∈[-96,+78]mm · 5 of 34 slices shown (2 of 2)]
[im 1/34]
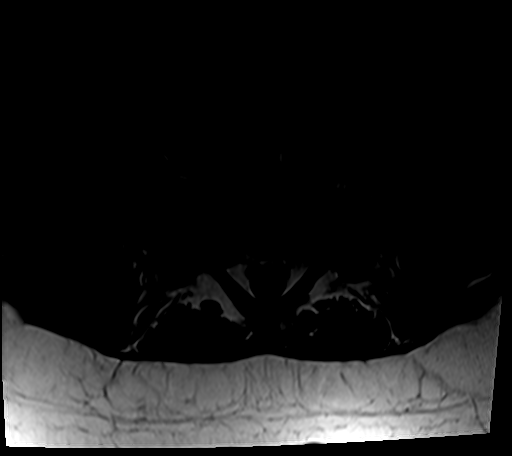
[im 5/34]
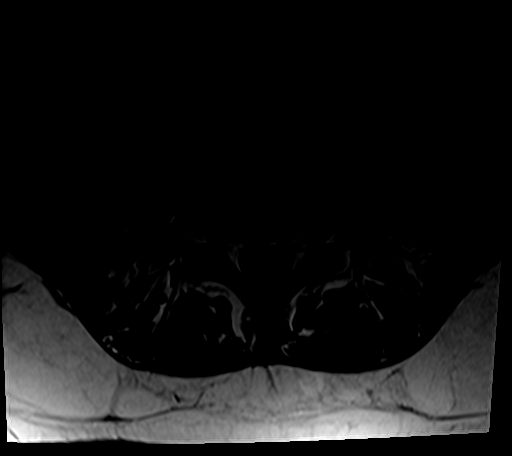
[im 10/34]
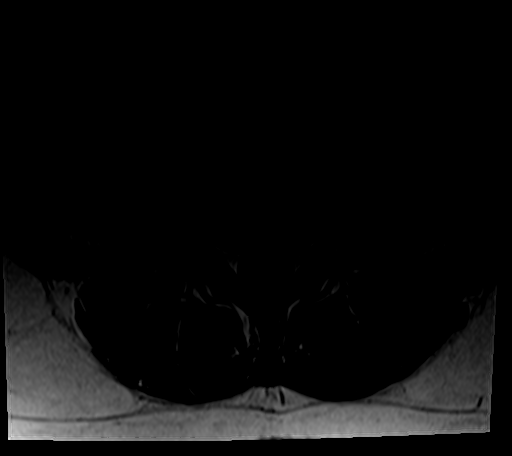
[im 17/34]
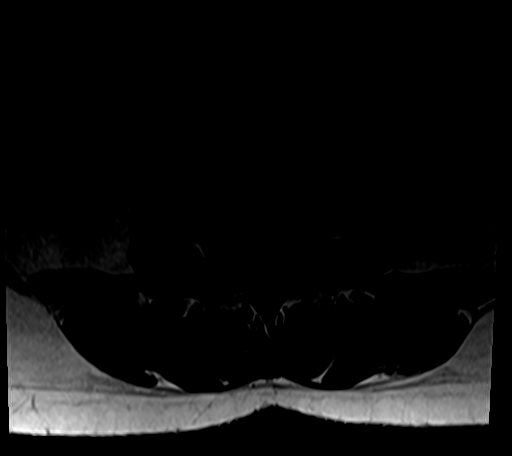
[im 29/34]
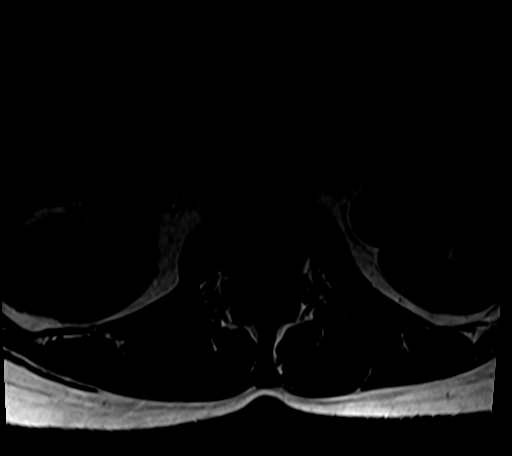

[26 of 48 positions shown; findings below may reference images not displayed]

FINDINGS: Segmentation: Normal lumbar segmentation is assumed with the lowest
fully formed disc space designated L5-S1.

Alignment:  Normal.

Vertebrae: No fracture, suspicious marrow lesion, or significant
marrow edema.

Conus medullaris and cauda equina: Conus extends to the upper L2
level. Conus and cauda equina appear normal.

Paraspinal and other soft tissues: Adnexal cyst measuring at least 6
cm in size, simple in appearance where imaged but with incomplete
coverage of the inferior and lateral aspects of the cyst.

Disc levels:

Disc height and hydration are maintained throughout the lumbar
spine. No disc herniation is identified, and the spinal canal and
neural foramina are widely patent.
IMPRESSION: 1. Unremarkable appearance of the lumbar spine. No evidence of
neural impingement.
2. 6 cm adnexal simple-appearing cyst, not adequately characterized.
Recommend prompt follow-up with pelvic US.
Reference: JACR [DATE]):[id number]
